# Patient Record
Sex: Female | Born: 1960 | Hispanic: No | Marital: Married | Smoking: Current every day smoker
Health system: Southern US, Community
[De-identification: ages and names within clinical notes are randomized; demographics above are authoritative.]

## PROBLEM LIST (undated history)

## (undated) DIAGNOSIS — R42 Dizziness and giddiness: Secondary | ICD-10-CM

## (undated) HISTORY — PX: APPENDECTOMY: SHX54

---

## 1998-02-03 ENCOUNTER — Other Ambulatory Visit: Admission: RE | Admit: 1998-02-03 | Discharge: 1998-02-03 | Payer: Self-pay | Admitting: Obstetrics & Gynecology

## 1999-02-07 ENCOUNTER — Other Ambulatory Visit: Admission: RE | Admit: 1999-02-07 | Discharge: 1999-02-07 | Payer: Self-pay | Admitting: Obstetrics and Gynecology

## 2000-02-06 ENCOUNTER — Other Ambulatory Visit: Admission: RE | Admit: 2000-02-06 | Discharge: 2000-02-06 | Payer: Self-pay | Admitting: Obstetrics and Gynecology

## 2001-02-28 ENCOUNTER — Other Ambulatory Visit: Admission: RE | Admit: 2001-02-28 | Discharge: 2001-02-28 | Payer: Self-pay | Admitting: Obstetrics and Gynecology

## 2001-06-08 ENCOUNTER — Observation Stay (HOSPITAL_COMMUNITY): Admission: EM | Admit: 2001-06-08 | Discharge: 2001-06-09 | Payer: Self-pay | Admitting: Emergency Medicine

## 2002-04-07 ENCOUNTER — Other Ambulatory Visit: Admission: RE | Admit: 2002-04-07 | Discharge: 2002-04-07 | Payer: Self-pay | Admitting: Obstetrics and Gynecology

## 2003-04-20 ENCOUNTER — Other Ambulatory Visit: Admission: RE | Admit: 2003-04-20 | Discharge: 2003-04-20 | Payer: Self-pay | Admitting: Obstetrics and Gynecology

## 2004-04-25 ENCOUNTER — Other Ambulatory Visit: Admission: RE | Admit: 2004-04-25 | Discharge: 2004-04-25 | Payer: Self-pay | Admitting: Obstetrics and Gynecology

## 2005-05-22 ENCOUNTER — Other Ambulatory Visit: Admission: RE | Admit: 2005-05-22 | Discharge: 2005-05-22 | Payer: Self-pay | Admitting: Obstetrics and Gynecology

## 2011-04-17 ENCOUNTER — Ambulatory Visit (INDEPENDENT_AMBULATORY_CARE_PROVIDER_SITE_OTHER): Payer: BC Managed Care – PPO | Admitting: Family Medicine

## 2011-04-17 VITALS — BP 121/68 | HR 81 | Temp 98.2°F | Resp 16 | Ht 64.75 in | Wt 147.2 lb

## 2011-04-17 DIAGNOSIS — N390 Urinary tract infection, site not specified: Secondary | ICD-10-CM

## 2011-04-17 DIAGNOSIS — R300 Dysuria: Secondary | ICD-10-CM

## 2011-04-17 LAB — POCT UA - MICROSCOPIC ONLY
Casts, Ur, LPF, POC: NEGATIVE
Crystals, Ur, HPF, POC: NEGATIVE
Mucus, UA: NEGATIVE
Yeast, UA: NEGATIVE

## 2011-04-17 LAB — POCT URINALYSIS DIPSTICK
Bilirubin, UA: NEGATIVE
Ketones, UA: NEGATIVE
Leukocytes, UA: NEGATIVE
Protein, UA: NEGATIVE
Spec Grav, UA: 1.01
pH, UA: 5.5

## 2011-04-17 MED ORDER — NITROFURANTOIN MONOHYD MACRO 100 MG PO CAPS: 100.0000 mg | ORAL_CAPSULE | Freq: Two times a day (BID) | ORAL | Status: AC

## 2011-04-17 NOTE — Progress Notes (Signed)
Patient Name: Joyce Pittman Date of Birth: November 01, 1960 Medical Record Number: [id number] Gender: female Date of Encounter: 04/17/2011  History of Present Illness:  Joyce Pittman is a 51 y.o. very pleasant female patient who presents with the following:  Here with urinary symptoms for a few days- started last week but she treated with cranberry juice and got better for a few days.  However, sympotms returned yesteday.  Notes frequency and dysuria.  Had a UTI about 2 years ago with similar symptoms.  No hematuria. No nausea, no vomiting, no fever- did notes a little back pain last night.  No vaginal symptoms.    LMP was about 3 weeks ago- on OCP.    There is no problem list on file for this patient.  History reviewed. No pertinent past medical history. History reviewed. No pertinent past surgical history. History  Substance Use Topics  . Smoking status: Current Everyday Smoker  . Smokeless tobacco: Not on file  . Alcohol Use: Yes     rare   Family History  Problem Relation Age of Onset  . Heart disease Mother   . Hyperlipidemia Mother    No Known Allergies  Medication list has been reviewed and updated.  Review of Systems: As per HPI- otherwise negative.  Physical Examination: Filed Vitals:   04/17/11 0835  BP: 121/68  Pulse: 81  Temp: 98.2 F (36.8 C)  TempSrc: Oral  Resp: 16  Height: 5' 4.75" (1.645 m)  Weight: 147 lb 3.2 oz (66.769 kg)    Body mass index is 24.68 kg/(m^2).  GEN: WDWN, NAD, Non-toxic, A & O x 3 HEENT: Atraumatic, Normocephalic. Neck supple. No masses, No LAD.  TM, oropharynx wnl Ears and Nose: No external deformity. CV: RRR, No M/G/R. No JVD. No thrill. No extra heart sounds. PULM: CTA B, no wheezes, crackles, rhonchi. No retractions. No resp. distress. No accessory muscle use. ABD: S, NT, ND, +BS. No rebound. No HSM.  No CVA tenderness EXTR: No c/c/e NEURO Normal gait.  PSYCH: Normally interactive. Conversant. Not depressed or anxious  appearing.  Calm demeanor.    Results for orders placed in visit on 04/17/11  POCT URINALYSIS DIPSTICK      Component Value Range   Color, UA yellow     Clarity, UA clear     Glucose, UA negative     Bilirubin, UA negative     Ketones, UA negative     Spec Grav, UA 1.010     Blood, UA trace     pH, UA 5.5     Protein, UA negative     Urobilinogen, UA 0.2     Nitrite, UA negative     Leukocytes, UA Negative    POCT UA - MICROSCOPIC ONLY      Component Value Range   WBC, Ur, HPF, POC 1-3     RBC, urine, microscopic 0-2     Bacteria, U Microscopic trace     Mucus, UA negative     Epithelial cells, urine per micros 1-2     Crystals, Ur, HPF, POC negative     Casts, Ur, LPF, POC negative     Yeast, UA negative      Assessment and Plan: 1. UTI (lower urinary tract infection)  Urine culture, nitrofurantoin, macrocrystal-monohydrate, (MACROBID) 100 MG capsule  2. Dysuria  POCT Urinalysis Dipstick, POCT UA - Microscopic Only   Urine is borderline, but she has been drinking a lot of water.  Await culture, but start tx with  macrobid.  Will f/u pending urine culture- Sooner if worse.

## 2011-04-20 ENCOUNTER — Other Ambulatory Visit: Payer: Self-pay | Admitting: Family Medicine

## 2011-04-20 DIAGNOSIS — N390 Urinary tract infection, site not specified: Secondary | ICD-10-CM

## 2011-04-20 MED ORDER — CIPROFLOXACIN HCL 500 MG PO TABS: 500.0000 mg | ORAL_TABLET | Freq: Two times a day (BID) | ORAL | Status: AC

## 2011-05-05 ENCOUNTER — Observation Stay (HOSPITAL_COMMUNITY)
Admission: EM | Admit: 2011-05-05 | Discharge: 2011-05-06 | Disposition: A | Discharge status: Home / Self Care | Payer: BC Managed Care – PPO | Source: Ambulatory Visit | Attending: Emergency Medicine | Admitting: Emergency Medicine

## 2011-05-05 ENCOUNTER — Encounter (HOSPITAL_COMMUNITY): Payer: Self-pay | Admitting: *Deleted

## 2011-05-05 DIAGNOSIS — M542 Cervicalgia: Secondary | ICD-10-CM

## 2011-05-05 DIAGNOSIS — R209 Unspecified disturbances of skin sensation: Principal | ICD-10-CM | POA: Insufficient documentation

## 2011-05-05 HISTORY — DX: Dizziness and giddiness: R42

## 2011-05-05 NOTE — ED Notes (Addendum)
Pt reports (L) sided neck pain that then made her numb down her (L) side that started at 9pm.  Reports that she was sitting when it happened.  Pt dizzy.  Pt ambulatory in triage.  Reports soreness, pain has decreased.  PERRLA.  CVA screen negative, no droop or drift noted, grips equal bilaterally, pt does have sensation on (L) side.  A/O x 4.  Pt has hx of dizziness and inner ear disease.

## 2011-05-06 ENCOUNTER — Emergency Department (HOSPITAL_COMMUNITY): Payer: BC Managed Care – PPO

## 2011-05-06 ENCOUNTER — Other Ambulatory Visit: Payer: Self-pay

## 2011-05-06 ENCOUNTER — Observation Stay (HOSPITAL_COMMUNITY): Payer: BC Managed Care – PPO

## 2011-05-06 DIAGNOSIS — G459 Transient cerebral ischemic attack, unspecified: Secondary | ICD-10-CM

## 2011-05-06 DIAGNOSIS — R209 Unspecified disturbances of skin sensation: Secondary | ICD-10-CM

## 2011-05-06 LAB — DIFFERENTIAL
Basophils Absolute: 0 K/uL (ref 0.0–0.1)
Basophils Relative: 0 % (ref 0–1)
Eosinophils Absolute: 0.2 K/uL (ref 0.0–0.7)
Eosinophils Relative: 2 % (ref 0–5)
Lymphocytes Relative: 26 % (ref 12–46)
Lymphs Abs: 3 K/uL (ref 0.7–4.0)
Monocytes Absolute: 0.8 K/uL (ref 0.1–1.0)
Monocytes Relative: 7 % (ref 3–12)
Neutro Abs: 7.5 K/uL (ref 1.7–7.7)
Neutrophils Relative %: 65 % (ref 43–77)

## 2011-05-06 LAB — URINALYSIS, ROUTINE W REFLEX MICROSCOPIC
Bilirubin Urine: NEGATIVE
Glucose, UA: NEGATIVE mg/dL
Hgb urine dipstick: NEGATIVE
Ketones, ur: NEGATIVE mg/dL
Leukocytes, UA: NEGATIVE
Nitrite: NEGATIVE
Protein, ur: NEGATIVE mg/dL
Specific Gravity, Urine: 1.015 (ref 1.005–1.030)
Urobilinogen, UA: 0.2 mg/dL (ref 0.0–1.0)
pH: 5.5 (ref 5.0–8.0)

## 2011-05-06 LAB — CBC
Hemoglobin: 13.2 g/dL (ref 12.0–15.0)
MCH: 32.3 pg (ref 26.0–34.0)
Platelets: 234 10*3/uL (ref 150–400)
RBC: 4.09 MIL/uL (ref 3.87–5.11)
WBC: 11.5 10*3/uL — ABNORMAL HIGH (ref 4.0–10.5)

## 2011-05-06 LAB — LIPID PANEL
Cholesterol: 155 mg/dL (ref 0–200)
HDL: 77 mg/dL
LDL Cholesterol: 68 mg/dL (ref 0–99)
Total CHOL/HDL Ratio: 2 ratio
Triglycerides: 50 mg/dL
VLDL: 10 mg/dL (ref 0–40)

## 2011-05-06 LAB — COMPREHENSIVE METABOLIC PANEL
Alkaline Phosphatase: 80 U/L (ref 39–117)
BUN: 10 mg/dL (ref 6–23)
Calcium: 8.4 mg/dL (ref 8.4–10.5)
Creatinine, Ser: 0.52 mg/dL (ref 0.50–1.10)
GFR calc Af Amer: >90 mL/min (ref >90)
Glucose, Bld: 90 mg/dL (ref 70–99)
Total Protein: 6.7 g/dL (ref 6.0–8.3)

## 2011-05-06 LAB — APTT: aPTT: 36 s (ref 24–37)

## 2011-05-06 LAB — PROTIME-INR
INR: 1.04 (ref 0.00–1.49)
Prothrombin Time: 13.8 s (ref 11.6–15.2)

## 2011-05-06 LAB — POCT I-STAT, CHEM 8
Calcium, Ion: 1.14 mmol/L (ref 1.12–1.32)
Chloride: 108 mEq/L (ref 96–112)
HCT: 39 % (ref 36.0–46.0)
Hemoglobin: 13.3 g/dL (ref 12.0–15.0)
TCO2: 22 mmol/L (ref 0–100)

## 2011-05-06 LAB — HEMOGLOBIN A1C
Hgb A1c MFr Bld: 5.3 %
Mean Plasma Glucose: 105 mg/dL

## 2011-05-06 MED ORDER — IOHEXOL 350 MG/ML SOLN
60.0000 mL | Freq: Once | INTRAVENOUS | Status: AC | PRN
  Administered 2011-05-06: 60 mL via INTRAVENOUS

## 2011-05-06 MED ORDER — ACETAMINOPHEN 325 MG PO TABS: 650.0000 mg | ORAL_TABLET | ORAL | Status: DC | PRN

## 2011-05-06 MED ORDER — LORAZEPAM 2 MG/ML IJ SOLN: 1.0000 mg | Freq: Once | INTRAMUSCULAR | Status: DC

## 2011-05-06 NOTE — ED Notes (Signed)
Patient is resting comfortably. 

## 2011-05-06 NOTE — ED Provider Notes (Signed)
Patient refused MRI on multiple occasions despite receiving Ativan. I explained the implications of refusing the test including missing a stroke. The patient accepts this risk and understands the consequences. A CTA will be performed in place. She also is awaiting carotid Dopplers.  Dayton Bailiff, MD 05/06/11 1001

## 2011-05-06 NOTE — Progress Notes (Signed)
VASCULAR LAB PRELIMINARY  PRELIMINARY  PRELIMINARY  PRELIMINARY  Carotid Dopplers completed.    Preliminary report:  No significant ICA stenosis.  Vertebral artery flow is antegrade.  Both diastolic and systolic velocities are elevated throughout, though plaque morphology does not support stenosis.  Joyce Pittman, 05/06/2011, 11:52 AM

## 2011-05-06 NOTE — ED Notes (Signed)
Pt denies pain, numbness, weakness at this time.

## 2011-05-06 NOTE — ED Provider Notes (Signed)
History     CSN: [id number]  Arrival date & time 05/05/11  2301   First MD Initiated Contact with Patient 05/06/11 0403      Chief Complaint  Patient presents with  . Numbness    (Consider location/radiation/quality/duration/timing/severity/associated sxs/prior treatment) HPI Is a 51 year old white female who felt a sudden pain in the left side of her neck about 11 PM yesterday. The pain was located in the muscles of the neck. This was followed soon by numbness over the left side of her face which then progressed to include the entire left side of her body. There was no associated weakness although she did have the sensation that she was moving to the left when she walked. There was no associated visual changes or difficulty with speaking. The numbness has subsequently resolved after several hours. The pain in the left side of her neck is still present. She also experienced sharp pain in the left occipital region soon after the pain in her neck; that pain has resolved. There are no known mitigating or exacerbating factors other than tenderness on palpation of the neck muscles.  Past Medical History  Diagnosis Date  . Dizziness     History reviewed. No pertinent past surgical history.  Family History  Problem Relation Age of Onset  . Heart disease Mother   . Hyperlipidemia Mother     History  Substance Use Topics  . Smoking status: Current Everyday Smoker -- 1.0 packs/day  . Smokeless tobacco: Not on file  . Alcohol Use: Yes     rare    OB History    Grav Para Term Preterm Abortions TAB SAB Ect Mult Living                  Review of Systems  All other systems reviewed and are negative.    Allergies  Review of patient's allergies indicates no known allergies.  Home Medications   Current Outpatient Rx  Name Route Sig Dispense Refill  . DROSPIRENONE-ETHINYL ESTRADIOL 3-0.03 MG PO TABS Oral Take 1 tablet by mouth daily.    . ADULT MULTIVITAMIN W/MINERALS CH Oral  Take 1 tablet by mouth daily.      BP 115/49  Pulse 72  Temp(Src) 98.2 F (36.8 C) (Oral)  Resp 18  SpO2 97%  LMP 04/22/2011  Physical Exam General: Well-developed, well-nourished female in no acute distress; appearance consistent with age of record HENT: normocephalic, atraumatic Eyes: pupils equal round and reactive to light; extraocular muscles intact Neck: supple; no carotid bruit Heart: regular rate and rhythm Lungs: clear to auscultation bilaterally Abdomen: soft; nondistended; nontender Extremities: No deformity; full range of motion; Neurologic: Awake, alert and oriented; motor function intact in all extremities and symmetric; no facial droop Skin: Warm and dry Psychiatric: Normal mood and affect    ED Course  Procedures (including critical care time)     MDM   Nursing notes and vitals signs, including pulse oximetry, reviewed.  Summary of this visit's results, reviewed by myself:   Imaging Studies: Ct Head Wo Contrast  05/06/2011  *RADIOLOGY REPORT*  Clinical Data: Left neck pain, facial numbness  CT HEAD WITHOUT CONTRAST  Technique:  Contiguous axial images were obtained from the base of the skull through the vertex without contrast.  Comparison: None.  Findings: No evidence of parenchymal hemorrhage or extra-axial fluid collection. No mass lesion, mass effect, or midline shift.  No CT evidence of acute infarction.  Cerebral volume is age appropriate.  No ventriculomegaly.  The visualized paranasal sinuses are essentially clear. The mastoid air cells are unopacified.  No evidence of calvarial fracture.  IMPRESSION: Normal head CT.  Original Report Authenticated By: Charline Bills, M.D.   4:18 AM We'll place on TIA protocol in the CDU for an ABCD2 score of 2.  7:40 AM Patient discussed with with Dr. Brooke Dare who will provide continuity of care.         Hanley Seamen, MD 05/06/11 [phone]

## 2011-05-06 NOTE — ED Notes (Signed)
Jamal called from mri and reports that pt has refused to have study completed even if she gets ativan for sedation. Dr Brooke Dare made aware.

## 2011-05-06 NOTE — ED Provider Notes (Signed)
She is resting comfortably. CT Angio completed, waiting on read. No needs at this time.  14:45 - CT Angio normal, patient remains asymptomatic. Feel she can be discharged home.  Rodena Medin, PA-C 05/06/11 1452

## 2011-05-06 NOTE — Discharge Instructions (Signed)
YOUR CT STUDY IS COMPLETELY NEGATIVE AND YOU CAN BE DISCHARGED HOME TO FOLLOW UP WITH YOUR DOCTOR FOR RECHECK THIS WEEK. RETURN HERE WITH ANY WORSENING SYMPTOMS OR NEW CONCERNS.

## 2011-05-06 NOTE — Progress Notes (Signed)
  Echocardiogram 2D Echocardiogram has been performed.  Tykesha Konicki, Real Cons 05/06/2011, 10:39 AM

## 2011-05-06 NOTE — ED Notes (Signed)
The pt and family getting impatient that the pt has not been seen by a doctor yet.

## 2011-05-06 NOTE — ED Notes (Signed)
Pt ambulatory to bathroom without assistance.

## 2011-05-06 NOTE — ED Notes (Signed)
approx 3 hours ago the pt developed lt facial numbness with some pain in the back of her head then the numbness  Spread to the entire lt side of her body. These symptoms lasted for approx 3 hours.  The numbness is totally gone now..   She still has a sl headache whenever she walks otherwise she has no pain just lying.  Alert skin warm and dry no distress.

## 2011-05-06 NOTE — ED Notes (Signed)
MD at bedside. 

## 2011-05-06 NOTE — ED Notes (Signed)
The pt is not having any pain or other symptoms.  Family remains at the bedside

## 2011-05-06 NOTE — ED Notes (Signed)
Vascular Tech is here to do carotid doppler study. New iv placed in left ac for ct angiol

## 2011-05-11 ENCOUNTER — Ambulatory Visit (INDEPENDENT_AMBULATORY_CARE_PROVIDER_SITE_OTHER): Payer: BC Managed Care – PPO | Admitting: Family Medicine

## 2011-05-11 ENCOUNTER — Telehealth: Payer: Self-pay

## 2011-05-11 VITALS — BP 124/65 | HR 86 | Temp 98.0°F | Resp 16 | Ht 64.0 in | Wt 145.0 lb

## 2011-05-11 DIAGNOSIS — R200 Anesthesia of skin: Secondary | ICD-10-CM

## 2011-05-11 DIAGNOSIS — R209 Unspecified disturbances of skin sensation: Secondary | ICD-10-CM

## 2011-05-11 LAB — D-DIMER, QUANTITATIVE: D-Dimer, Quant: 0.51 ug/mL-FEU — ABNORMAL HIGH (ref 0.00–0.48)

## 2011-05-11 LAB — POCT SEDIMENTATION RATE: POCT SED RATE: 31 mm/hr — AB (ref 0–22)

## 2011-05-11 NOTE — Progress Notes (Signed)
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  She had associated numbness on entire left side of body with cold skin associated.    Had Cipro for UTI several weeks ago.  O:  Both vertebral arteries are patent to the basilar. PICA, AICA,  superior cerebellar, and posterior cerebral arteries are all widely  patent without significant stenosis. No vascular malformation in  the posterior circulation.  Internal carotid artery is widely patent bilaterally without  atherosclerotic disease or stenosis. Anterior and middle cerebral  arteries are patent bilaterally. No significant intracranial  stenosis.  Negative for vascular malformation.  Major dural sinuses are patent.  Review of the MIP images confirms the above findings.  IMPRESSION:  Normal  MRI HEAD WITHOUT CONTRAST  Technique: Multiplanar, multiecho pulse sequences of the brain and  surrounding structures were obtained according to standard protocol  without intravenous contrast.  Comparison: CT head 05/06/2011  Findings: Limited study was performed with axial diffusion and T2  and sagittal T1 images.  Negative for acute infarct. No significant chronic ischemia. No  mass lesion present. Ventricle size is normal.  IMPRESSION:  Normal   Results for orders placed during the hospital encounter of 05/05/11  CBC      Component Value Range   WBC 11.5 (*) 4.0 - 10.5 (K/uL)   RBC 4.09  3.87 - 5.11 (MIL/uL)   Hemoglobin 13.2  12.0 - 15.0 (g/dL)   HCT 16.1  09.6 - 04.5 (%)   MCV 95.1  78.0 - 100.0 (fL)   MCH 32.3  26.0 - 34.0 (pg)   MCHC 33.9  30.0 - 36.0 (g/dL)   RDW 40.9  81.1 - 91.4 (%)   Platelets 234  150 - 400 (K/uL)  DIFFERENTIAL      Component Value Range   Neutrophils Relative 65  43 - 77 (%)   Neutro Abs 7.5  1.7 - 7.7 (K/uL)   Lymphocytes Relative 26  12 - 46 (%)   Lymphs Abs 3.0  0.7 - 4.0 (K/uL)   Monocytes Relative 7  3 -  12 (%)   Monocytes Absolute 0.8  0.1 - 1.0 (K/uL)   Eosinophils Relative 2  0 - 5 (%)   Eosinophils Absolute 0.2  0.0 - 0.7 (K/uL)   Basophils Relative 0  0 - 1 (%)   Basophils Absolute 0.0  0.0 - 0.1 (K/uL)  COMPREHENSIVE METABOLIC PANEL      Component Value Range   Sodium 138  135 - 145 (mEq/L)   Potassium 4.2  3.5 - 5.1 (mEq/L)   Chloride 105  96 - 112 (mEq/L)   CO2 22  19 - 32 (mEq/L)   Glucose, Bld 90  70 - 99 (mg/dL)   BUN 10  6 - 23 (mg/dL)   Creatinine, Ser 7.82  0.50 - 1.10 (mg/dL)   Calcium 8.4  8.4 - 95.6 (mg/dL)   Total Protein 6.7  6.0 - 8.3 (g/dL)   Albumin 3.2 (*) 3.5 - 5.2 (g/dL)   AST 31  0 - 37 (U/L)   ALT 43 (*) 0 - 35 (U/L)   Alkaline Phosphatase 80  39 - 117 (U/L)   Total Bilirubin 0.2 (*) 0.3 - 1.2 (mg/dL)   GFR calc non Af Amer >90  >90 (mL/min)   GFR calc Af Amer >90  >90 (mL/min)  PROTIME-INR      Component Value Range   Prothrombin  Time 13.8  11.6 - 15.2 (seconds)   INR 1.04  0.00 - 1.49   APTT      Component Value Range   aPTT 36  24 - 37 (seconds)  URINALYSIS, ROUTINE W REFLEX MICROSCOPIC      Component Value Range   Color, Urine YELLOW  YELLOW    APPearance CLEAR  CLEAR    Specific Gravity, Urine 1.015  1.005 - 1.030    pH 5.5  5.0 - 8.0    Glucose, UA NEGATIVE  NEGATIVE (mg/dL)   Hgb urine dipstick NEGATIVE  NEGATIVE    Bilirubin Urine NEGATIVE  NEGATIVE    Ketones, ur NEGATIVE  NEGATIVE (mg/dL)   Protein, ur NEGATIVE  NEGATIVE (mg/dL)   Urobilinogen, UA 0.2  0.0 - 1.0 (mg/dL)   Nitrite NEGATIVE  NEGATIVE    Leukocytes, UA NEGATIVE  NEGATIVE   LIPID PANEL      Component Value Range   Cholesterol 155  0 - 200 (mg/dL)   Triglycerides 50  <161 (mg/dL)   HDL 77  >09 (mg/dL)   Total CHOL/HDL Ratio 2.0     VLDL 10  0 - 40 (mg/dL)   LDL Cholesterol 68  0 - 99 (mg/dL)  HEMOGLOBIN U0A      Component Value Range   Hemoglobin A1C 5.3  <5.7 (%)   Mean Plasma Glucose 105  <117 (mg/dL)  POCT I-STAT, CHEM 8      Component Value Range   Sodium  141  135 - 145 (mEq/L)   Potassium 4.1  3.5 - 5.1 (mEq/L)   Chloride 108  96 - 112 (mEq/L)   BUN 9  6 - 23 (mg/dL)   Creatinine, Ser 5.40  0.50 - 1.10 (mg/dL)   Glucose, Bld 91  70 - 99 (mg/dL)   Calcium, Ion 9.81  1.91 - 1.32 (mmol/L)   TCO2 22  0 - 100 (mmol/L)   Hemoglobin 13.3  12.0 - 15.0 (g/dL)   HCT 47.8  29.5 - 62.1 (%)    PE today: alert, NAD HEENT:  Normal with normal fundi Neck:  Supple, no adenopathy.  Prominent left trapezius; no bruit Chest:  Clear Heart:  Reg, no murmur Neuro:  Cn III-XII intact  Motor: symmetric  Sensory:  Normal  Gait: normal  A:  Unusual syndrome that may be cervical spine disease;  No significant abnormalities found to explain this  P:  Neuro consult Check d'dimer and sed rate Results for orders placed in visit on 05/11/11  POCT SEDIMENTATION RATE      Component Value Range   POCT SED RATE 31 (*) 0 - 22 (mm/hr)

## 2011-05-11 NOTE — Progress Notes (Signed)
Observation review is complete for the 4/5 visit.

## 2011-05-16 ENCOUNTER — Other Ambulatory Visit: Payer: Self-pay | Admitting: Family Medicine

## 2011-05-16 DIAGNOSIS — M542 Cervicalgia: Secondary | ICD-10-CM

## 2011-05-16 MED ORDER — TRAMADOL HCL 50 MG PO TABS: 50.0000 mg | ORAL_TABLET | Freq: Three times a day (TID) | ORAL | Status: AC | PRN

## 2011-05-16 NOTE — Telephone Encounter (Signed)
Pt is calling stating that she can not get into [facility] til 05/22/11 and is needing a pain medication for muscle spasms .  And would like to talk with dr Milus Glazier about the referral to [facility] because the numbness has stopped

## 2011-05-17 NOTE — Telephone Encounter (Signed)
I spoke with patient yesterday and I requested an ortho referral for her neck.  Did this go through?

## 2013-04-28 ENCOUNTER — Encounter: Payer: Self-pay | Admitting: Family Medicine

## 2013-04-28 ENCOUNTER — Ambulatory Visit (INDEPENDENT_AMBULATORY_CARE_PROVIDER_SITE_OTHER): Payer: BC Managed Care – PPO | Admitting: Family Medicine

## 2013-04-28 VITALS — BP 118/67 | HR 74 | Temp 97.7°F | Resp 16 | Ht 65.0 in | Wt 167.0 lb

## 2013-04-28 DIAGNOSIS — Z8739 Personal history of other diseases of the musculoskeletal system and connective tissue: Secondary | ICD-10-CM | POA: Insufficient documentation

## 2013-04-28 DIAGNOSIS — R635 Abnormal weight gain: Secondary | ICD-10-CM

## 2013-04-28 DIAGNOSIS — Z23 Encounter for immunization: Secondary | ICD-10-CM

## 2013-04-28 DIAGNOSIS — Z13220 Encounter for screening for lipoid disorders: Secondary | ICD-10-CM

## 2013-04-28 LAB — CBC WITH DIFFERENTIAL/PLATELET
BASOS ABS: 0 10*3/uL (ref 0.0–0.1)
Basophils Relative: 0 % (ref 0–1)
EOS PCT: 1 % (ref 0–5)
Eosinophils Absolute: 0.1 10*3/uL (ref 0.0–0.7)
HEMATOCRIT: 40.3 % (ref 36.0–46.0)
HEMOGLOBIN: 13.9 g/dL (ref 12.0–15.0)
LYMPHS ABS: 1.5 10*3/uL (ref 0.7–4.0)
LYMPHS PCT: 24 % (ref 12–46)
MCH: 32.3 pg (ref 26.0–34.0)
MCHC: 34.5 g/dL (ref 30.0–36.0)
MCV: 93.7 fL (ref 78.0–100.0)
MONO ABS: 0.4 10*3/uL (ref 0.1–1.0)
MONOS PCT: 6 % (ref 3–12)
NEUTROS ABS: 4.4 10*3/uL (ref 1.7–7.7)
Neutrophils Relative %: 69 % (ref 43–77)
Platelets: 225 10*3/uL (ref 150–400)
RBC: 4.3 MIL/uL (ref 3.87–5.11)
RDW: 13 % (ref 11.5–15.5)
WBC: 6.4 10*3/uL (ref 4.0–10.5)

## 2013-04-28 LAB — COMPREHENSIVE METABOLIC PANEL
ALT: 12 U/L (ref 0–35)
AST: 13 U/L (ref 0–37)
Albumin: 4.3 g/dL (ref 3.5–5.2)
Alkaline Phosphatase: 53 U/L (ref 39–117)
BILIRUBIN TOTAL: 0.5 mg/dL (ref 0.2–1.2)
BUN: 12 mg/dL (ref 6–23)
CALCIUM: 9.4 mg/dL (ref 8.4–10.5)
CHLORIDE: 107 meq/L (ref 96–112)
CO2: 24 mEq/L (ref 19–32)
CREATININE: 0.72 mg/dL (ref 0.50–1.10)
Glucose, Bld: 97 mg/dL (ref 70–99)
Potassium: 5 mEq/L (ref 3.5–5.3)
Sodium: 138 mEq/L (ref 135–145)
Total Protein: 7.1 g/dL (ref 6.0–8.3)

## 2013-04-28 LAB — LIPID PANEL
CHOL/HDL RATIO: 2.6 ratio
CHOLESTEROL: 174 mg/dL (ref 0–200)
HDL: 66 mg/dL (ref >39)
LDL Cholesterol: 86 mg/dL (ref 0–99)
TRIGLYCERIDES: 112 mg/dL (ref <150)
VLDL: 22 mg/dL (ref 0–40)

## 2013-04-28 LAB — TSH: TSH: 1.843 u[IU]/mL (ref 0.350–4.500)

## 2013-04-28 NOTE — Patient Instructions (Addendum)
Good to see you today!  I will be in touch with your labs.  You received your tetanus shot today- the Tdap which also protects you against pertussis or whooping cough.  Congrats on quitting smoking.    It is time for your colonoscopy. Please call the GI office of your choice-  [facility], [facility] or [facility] are some good options in town.  You can schedule this test at your convenience.

## 2013-04-28 NOTE — Progress Notes (Signed)
[facility] [address], [place] [id number] [phone]  Date:  04/28/2013   Name:  Joyce PelCynthia Imburgia   DOB:  05/27/1960   MRN:  [id number]  PCP:  No primary provider on file.    Chief Complaint: Establish Care   History of Present Illness:  Joyce PelCynthia Schlitt is a 53 y.o. very pleasant female patient who presents with the following:  She is here today to establish care.   She has had some history of muscle spasms, but otherwise she is generally healthy.   She sees Huel CoteKathy Richardson for her GYN care.   Her pap and mammogram are UTD- she will be seen in July for her next mammogram, and her pap in September She does notice that she gained some weight recently; she had been stable for the last 10- 15 years but then started to gain.  She has put on about 20 lbs in the last couple of years.  She has managed to lose about 5 lbs through exercise.  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  She enjoys gardening, hunting and fishing  She is still on OCP.   She did quit smoking- 03/29/2012.    She has not eaten yet today.   Unsure date of last tetanus but it has been a while No colonoscopy yet.  Wt Readings from Last 3 Encounters:  04/28/13 167 lb (75.751 kg)  05/11/11 145 lb (65.772 kg)  04/17/11 147 lb 3.2 oz (66.769 kg)    There are no active problems to display for this patient.   Past Medical History  Diagnosis Date  . Dizziness     Past Surgical History  Procedure Laterality Date  . Appendectomy      History  Substance Use Topics  . Smoking status: Former Smoker -- 1.00 packs/day    Types: Cigarettes  . Smokeless tobacco: Not on file  . Alcohol Use: Yes     Comment: social    Family History  Problem Relation Age of Onset  . Heart disease Mother   . Hyperlipidemia Mother     No Known Allergies  Medication list has been reviewed and updated.  Current Outpatient Prescriptions on File Prior to  Visit  Medication Sig Dispense Refill  . drospirenone-ethinyl estradiol (OCELLA) 3-0.03 MG tablet Take 1 tablet by mouth daily.      . Multiple Vitamin (MULITIVITAMIN WITH MINERALS) TABS Take 1 tablet by mouth daily.       No current facility-administered medications on file prior to visit.    Review of Systems:  As per HPI- otherwise negative.   Physical Examination: Filed Vitals:   04/28/13 0905  BP: 118/67  Pulse: 74  Temp: 97.7 F (36.5 C)  Resp: 16   Filed Vitals:   04/28/13 0905  Height: 5\' 5"  (1.651 m)  Weight: 167 lb (75.751 kg)   Body mass index is 27.79 kg/(m^2). Ideal Body Weight: Weight in (lb) to have BMI = 25: 149.9  GEN: WDWN, NAD, Non-toxic, A & O x 3, looks well HEENT: Atraumatic, Normocephalic. Neck supple. No masses, No LAD. Ears and Nose: No external deformity. CV: RRR, No M/G/R. No JVD. No thrill. No extra heart sounds. PULM: CTA B, no wheezes, crackles, rhonchi. No retractions. No resp. distress. No accessory muscle use. ABD: S, NT, ND. No rebound. No HSM. EXTR: No c/c/e NEURO Normal gait.  PSYCH: Normally interactive. Conversant. Not depressed or  anxious appearing.  Calm demeanor.    Assessment and Plan: Screening for hyperlipidemia - Plan: Lipid panel  Weight gain - Plan: CBC with Differential, TSH, Comprehensive metabolic panel  Immunization due - Plan: Tdap vaccine greater than or equal to 7yo IM  History of muscle spasm  Recent weight gain. Discussed with pt in detail.  May be just menopause but we will check her thyroid.  She is willing to work on her weight with increased exercise and fewer calories.  Update tdap Labs today as above.   Her GYN care is UTD Encouraged her to schedule a colonoscopy   Signed Abbe Amsterdam, MD

## 2014-12-10 ENCOUNTER — Ambulatory Visit (INDEPENDENT_AMBULATORY_CARE_PROVIDER_SITE_OTHER): Payer: BLUE CROSS/BLUE SHIELD

## 2014-12-10 ENCOUNTER — Ambulatory Visit (INDEPENDENT_AMBULATORY_CARE_PROVIDER_SITE_OTHER): Payer: BLUE CROSS/BLUE SHIELD | Admitting: Family Medicine

## 2014-12-10 VITALS — BP 110/70 | HR 87 | Temp 98.3°F | Resp 18 | Ht 64.0 in | Wt 161.8 lb

## 2014-12-10 DIAGNOSIS — M25461 Effusion, right knee: Secondary | ICD-10-CM | POA: Diagnosis not present

## 2014-12-10 NOTE — Progress Notes (Signed)
This chart was scribed for Elvina Sidle, MD by Stann Ore, medical scribe at [facility].The patient was seen in exam room 13 and the patient's care was started at 1:36 PM.  Patient ID: Joyce Pittman MRN: [id number], DOB: 05-04-1960, 54 y.o. Date of Encounter: 12/10/2014  Primary Physician: No primary care provider on file.  Chief Complaint:  Chief Complaint  Patient presents with  . OTHER    knee swollen, couple weeks    HPI:  Bernadette Gores is a 54 y.o. female who presents to [facility] complaining of right knee swollen with aches. Last week, she turned it and felt aches in the area. Yesterday afternoon, she felt a popping sensation and now feels pain in the area.   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   Past Medical History  Diagnosis Date  . Dizziness      Home Meds: Prior to Admission medications   Medication Sig Start Date End Date Taking? Authorizing Provider  Multiple Vitamin (MULITIVITAMIN WITH MINERALS) TABS Take 1 tablet by mouth daily.   Yes Historical Provider, MD  drospirenone-ethinyl estradiol (OCELLA) 3-0.03 MG tablet Take 1 tablet by mouth daily.    Historical Provider, MD    Allergies: No Known Allergies  Social History   Social History  . Marital Status: Married    Spouse Name: N/A  . Number of Children: N/A  . Years of Education: N/A   Occupational History  . Not on file.   Social History Main Topics  . Smoking status: Former Smoker -- 1.00 packs/day    Types: Cigarettes  . Smokeless tobacco: Not on file  . Alcohol Use: Yes     Comment: social  . Drug Use: No  . Sexual Activity: Yes    Birth Control/ Protection: Pill   Other Topics Concern  . Not on file   Social History Narrative     Review of Systems: Constitutional: negative for fever, chills, night sweats, weight changes, or fatigue  HEENT: negative for vision changes, hearing loss, congestion, rhinorrhea, ST, epistaxis, or  sinus pressure Cardiovascular: negative for chest pain or palpitations Respiratory: negative for hemoptysis, wheezing, shortness of breath, or cough Abdominal: negative for abdominal pain, nausea, vomiting, diarrhea, or constipation Dermatological: negative for rash Neurologic: negative for headache, dizziness, or syncope Musc: positive for arthralgia (right knee)  All other systems reviewed and are otherwise negative with the exception to those above and in the HPI.  Physical Exam: Blood pressure 110/70, pulse 87, temperature 98.3 F (36.8 C), temperature source Oral, resp. rate 18, height  (1.626 m), weight 161 lb 12.8 oz (73.392 kg), last menstrual period 03/24/2014, SpO2 98 %., Body mass index is 27.76 kg/(m^2). General: Well developed, well nourished, in no acute distress. Head: Normocephalic, atraumatic, eyes without discharge, sclera non-icteric, nares are without discharge. Bilateral auditory canals clear, TM's are without perforation, pearly grey and translucent with reflective cone of light bilaterally. Oral cavity moist, posterior pharynx without exudate, erythema, peritonsillar abscess, or post nasal drip.  Neck: Supple. No thyromegaly. Full ROM. No lymphadenopathy. Lungs: Clear bilaterally to auscultation without wheezes, rales, or rhonchi. Breathing is unlabored. Heart: RRR with S1 S2. No murmurs, rubs, or gallops appreciated. Msk:  Strength and tone normal for age. Extremities/Skin: Warm and dry. No clubbing or cyanosis. Right knee swollen and mildly tender with lateral stress, negative lachman's test,  Neuro: Alert and oriented X 3. Moves all extremities spontaneously. Gait is normal. CNII-XII grossly  in tact. Psych:  Responds to questions appropriately with a normal affect.   [facility] reading (PRIMARY) by Dr. Milus GlazierLauenstein : right knee xray: soft tissue swelling, small calcification just anterior tubercle    ASSESSMENT AND PLAN:  54 y.o. year old female with  This chart was  scribed in my presence and reviewed by me personally.    ICD-9-CM ICD-10-CM   1. Knee effusion, right 719.06 M25.461 DG Knee Complete 4 Views Right     AMB referral to orthopedics    Urgent ortho referral  By signing my name below, I, Stann Oresung-Kai Tsai, attest that this documentation has been prepared under the direction and in the presence of Elvina SidleKurt Shakeem Stern, MD. Electronically Signed: Stann Oresung-Kai Tsai, Scribe. 12/10/2014 , 1:36 PM .  Signed, Elvina SidleKurt Brailon Don, MD 12/10/2014 1:36 PM

## 2014-12-10 NOTE — Patient Instructions (Signed)
You have an appt scheduled at [facility] today at 2:45 pm with Dr. Madelon Lipsaffrey. Office address is [employer] ph# [phone]  Knee Effusion Knee effusion means that you have excess fluid in your knee joint. This can cause pain and swelling in your knee. This may make your knee more difficult to bend and move. That is because there is increased pain and pressure in the joint. If there is fluid in your knee, it often means that something is wrong inside your knee, such as severe arthritis, abnormal inflammation, or an infection. Another common cause of knee effusion is an injury to the knee muscles, ligaments, or cartilage. HOME CARE INSTRUCTIONS  Use crutches as directed by your health care provider.  Wear a knee brace as directed by your health care provider.  Apply ice to the swollen area:  Put ice in a plastic bag.  Place a towel between your skin and the bag.  Leave the ice on for 20 minutes, 2-3 times per day.  Keep your knee raised (elevated) when you are sitting or lying down.  Take medicines only as directed by your health care provider.  Do any rehabilitation or strengthening exercises as directed by your health care provider.  Rest your knee as directed by your health care provider. You may start doing your normal activities again when your health care provider approves.   Keep all follow-up visits as directed by your health care provider. This is important. SEEK MEDICAL CARE IF:  You have ongoing (persistent) pain in your knee. SEEK IMMEDIATE MEDICAL CARE IF:  You have increased swelling or redness of your knee.  You have severe pain in your knee.  You have a fever.   This information is not intended to replace advice given to you by your health care provider. Make sure you discuss any questions you have with your health care provider.   Document Released: 04/08/2003 Document Revised: 02/06/2014 Document Reviewed: 09/01/2013 Elsevier Interactive  Patient Education 2016 Elsevier Inc. Meniscus Tear A meniscus tear is a knee injury in which a piece of the meniscus is torn. The meniscus is a thick, rubbery, wedge-shaped cartilage in the knee. Two menisci are located in each knee. They sit between the upper bone (femur) and lower bone (tibia) that make up the knee joint. Each meniscus acts as a shock absorber for the knee. A torn meniscus is one of the most common types of knee injuries. This injury can range from mild to severe. Surgery may be needed for a severe tear. CAUSES This injury may be caused by any squatting, twisting, or pivoting movement. Sports-related injuries are the most common cause. These often occur from:  Running and stopping suddenly.  Changing direction.  Being tackled or knocked off your feet. As people get older, their meniscus gets thinner and weaker. In these people, tears can happen more easily, such as from climbing stairs.  RISK FACTORS This injury is more likely to happen to:  People who play contact sports.  Males.  People who are 4430-52 years of age. SYMPTOMS  Symptoms of this injury include:  Knee pain, especially at the side of the knee joint. You may feel pain when the injury occurs, or you may only hear a pop and feel pain later.  A feeling that your knee is clicking, catching, locking, or giving way.  Not being able to fully bend or extend your knee.  Bruising or swelling in your knee. DIAGNOSIS  This injury  may be diagnosed based on your symptoms and a physical exam. The physical exam may include:  Moving your knee in different ways.  Feeling for tenderness.  Listening for a clicking sound.  Checking if your knee locks or catches. You may also have tests, such as:  X-rays.  MRI.  A procedure to look inside your knee with a narrow surgical telescope (arthroscopy). You may be referred to a knee specialist (orthopedic surgeon). TREATMENT  Treatment for this injury depends on the  severity of the tear. Treatment for a mild tear may include:  Rest.  Medicine to reduce pain and swelling. This is usually a nonsteroidal anti-inflammatory drug (NSAID).  A knee brace or an elastic sleeve or wrap.  Using crutches or a walker to keep weight off your knee and to help you walk.  Exercises to strengthen your knee (physical therapy). You may need surgery if you have a severe tear or if other treatments are not working.  HOME CARE INSTRUCTIONS Managing Pain and Swelling  Take over-the-counter and prescription medicines only as told by your health care provider.  If directed, apply ice to the injured area:  Put ice in a plastic bag.  Place a towel between your skin and the bag.  Leave the ice on for 20 minutes, 2-3 times per day.  Raise (elevate) the injured area above the level of your heart while you are sitting or lying down. Activity  Do not use the injured limb to support your body weight until your health care provider says that you can. Use crutches or a walker as told by your health care provider.  Return to your normal activities as told by your health care provider. Ask your health care provider what activities are safe for you.  Perform range-of-motion exercises only as told by your health care provider.  Begin doing exercises to strengthen your knee and leg muscles only as told by your health care provider. After you recover, your health care provider may recommend these exercises to help prevent another injury. General Instructions  Use a knee brace or elastic wrap as told by your health care provider.  Keep all follow-up visits as told by your health care provider. This is important. SEEK MEDICAL CARE IF:  You have a fever.  Your knee becomes red, tender, or swollen.  Your pain medicine is not helping.  Your symptoms get worse or do not improve after 2 weeks of home care.   This information is not intended to replace advice given to you by  your health care provider. Make sure you discuss any questions you have with your health care provider.   Document Released: 04/08/2002 Document Revised: 10/07/2014 Document Reviewed: 05/11/2014 Elsevier Interactive Patient Education [employer]

## 2015-05-06 ENCOUNTER — Ambulatory Visit (INDEPENDENT_AMBULATORY_CARE_PROVIDER_SITE_OTHER): Payer: BLUE CROSS/BLUE SHIELD | Admitting: Family Medicine

## 2015-05-06 ENCOUNTER — Encounter: Payer: Self-pay | Admitting: Family Medicine

## 2015-05-06 VITALS — BP 122/74 | HR 86 | Temp 98.6°F | Ht 64.0 in | Wt 163.4 lb

## 2015-05-06 DIAGNOSIS — M7989 Other specified soft tissue disorders: Secondary | ICD-10-CM

## 2015-05-06 DIAGNOSIS — M2540 Effusion, unspecified joint: Secondary | ICD-10-CM

## 2015-05-06 DIAGNOSIS — M2550 Pain in unspecified joint: Secondary | ICD-10-CM | POA: Diagnosis not present

## 2015-05-06 LAB — RHEUMATOID FACTOR: Rhuematoid fact SerPl-aCnc: <10 IU/mL (ref <=14)

## 2015-05-06 MED ORDER — CYCLOBENZAPRINE HCL 10 MG PO TABS: 10.0000 mg | ORAL_TABLET | Freq: Two times a day (BID) | ORAL | Status: DC | PRN

## 2015-05-06 NOTE — Patient Instructions (Addendum)
We will do some labwork today to make sure there is no sign of rheumatoid arthritis I will be in touch with your labs asap Try the flexeril as needed but remember this can make you sleepy!  Do not use if you need to drive and do not combine with hydrocodone  Let me know if you have any change or worsening of your symptoms and I will be in touch with results soon

## 2015-05-06 NOTE — Progress Notes (Signed)
Pre visit review using our clinic review tool, if applicable. No additional management support is needed unless otherwise documented below in the visit note. 

## 2015-05-06 NOTE — Progress Notes (Signed)
[place] at [facility] [address] [place], [place] [address] [phone]  Date:  05/06/2015   Name:  Joyce Pittman   DOB:  1960/04/04   MRN:  [id number]  PCP:  Lamar Blinks, MD    Chief Complaint: Joint Pain   History of Present Illness:  Joyce Pittman is a 55 y.o. very pleasant female patient who presents with the following:  Here today with a concern about joint swelling and pain.   She had a right knee surgery in the fall -she did well but it then swelled up again in February. She saw Dr. French Ana and he put her back on meloxicam prn.  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 The next am her arms were both stiff and swollen.  It took about a week before her arms felt good again- they are still a bit sore now She used the meloxicam again which did seem to help.  However she notes that when she tries to stop the meloxicam her symptoms will return She continues to notice some swelling and pain of various joints that moves around to different joints Last week her left knee was very painful- it is better now  She has had occasional overuse injurues but nothing this persistent in the past.   Her PGF had rheumatoid arthritis No history of autoimmune disorder  She has a few hydrocodone left- uses them just prn Post menopausal   Patient Active Problem List   Diagnosis Date Noted  . History of muscle spasm 04/28/2013    Past Medical History  Diagnosis Date  . Dizziness     Past Surgical History  Procedure Laterality Date  . Appendectomy      Social History  Substance Use Topics  . Smoking status: Former Smoker -- 1.00 packs/day    Types: Cigarettes  . Smokeless tobacco: None  . Alcohol Use: Yes     Comment: social    Family History  Problem Relation Age of Onset  . Heart disease Father   . Hyperlipidemia Father      No Known Allergies  Medication list has been reviewed and updated.  Current Outpatient Prescriptions on File Prior to Visit  Medication Sig Dispense Refill  . Multiple Vitamin (MULITIVITAMIN WITH MINERALS) TABS Take 1 tablet by mouth daily.     No current facility-administered medications on file prior to visit.    Review of Systems:  As per HPI- otherwise negative.   Physical Examination: Filed Vitals:   05/06/15 1543  BP: 122/74  Pulse: 86  Temp: 98.6 F (37 C)   Filed Vitals:   05/06/15 1543  Height: '5\' 4"'$  (1.626 m)  Weight: 163 lb 6.4 oz (74.118 kg)   Body mass index is 28.03 kg/(m^2). Ideal Body Weight: Weight in (lb) to have BMI = 25: 145.3  GEN: WDWN, NAD, Non-toxic, A & O x 3, mild overweight, looks well HEENT: Atraumatic, Normocephalic. Neck supple. No masses, No LAD.  Bilateral TM wnl, oropharynx normal.  PEERL,EOMI.   Ears and Nose: No external deformity. CV: RRR, No M/G/R. No JVD. No thrill. No extra heart sounds. PULM: CTA B, no wheezes, crackles, rhonchi. No retractions. No resp. distress. No accessory muscle use. ABD: S, NT, ND, +BS. No rebound. No HSM. EXTR: No c/c/e NEURO Normal gait.  PSYCH: Normally interactive. Conversant. Not depressed  or anxious appearing.  Calm demeanor.  She still has minimal tenderness in her bilateral arms. Normal strength  No other joint swelling at this time    Assessment and Plan: Joint pain - Plan: cyclobenzaprine (FLEXERIL) 10 MG tablet, Sed Rate (ESR), C-reactive protein, Rheumatoid Factor, Cyclic citrul peptide antibody, IgG  Joint swelling - Plan: Sed Rate (ESR), C-reactive protein, Rheumatoid Factor  Forearm swelling - Plan: CK (Creatine Kinase)  Suspect that her joint sx are simply from overuse but will check labs to ensure no sign of RA Flexeril to use as needed Will plan further follow- up pending labs. See patient instructions for more details.     Signed Lamar Blinks, MD

## 2015-05-07 LAB — CK: Total CK: 41 U/L (ref 7–177)

## 2015-05-07 LAB — SEDIMENTATION RATE: SED RATE: 23 mm/h — AB (ref 0–22)

## 2015-05-07 LAB — C-REACTIVE PROTEIN: CRP: 0.3 mg/dL — AB (ref 0.5–20.0)

## 2015-05-07 LAB — CYCLIC CITRUL PEPTIDE ANTIBODY, IGG

## 2015-05-10 ENCOUNTER — Encounter: Payer: Self-pay | Admitting: Family Medicine

## 2015-05-11 ENCOUNTER — Telehealth: Payer: Self-pay | Admitting: Family Medicine

## 2015-05-11 NOTE — Telephone Encounter (Signed)
Relation to WU:JWJXpt:self Call back [phone]   Reason for call:  Patient inquiring about lab results

## 2015-05-12 NOTE — Telephone Encounter (Signed)
[facility], I sent a letter to her home

## 2015-06-03 ENCOUNTER — Telehealth: Payer: Self-pay | Admitting: Family Medicine

## 2015-06-03 NOTE — Telephone Encounter (Signed)
Can be reached: [phone] Pharmacy: [id number] [place]  - [address]  Reason for call: pt needing refill on meloxicam. She has 5 left. Takes 1/day.

## 2015-09-01 ENCOUNTER — Ambulatory Visit (INDEPENDENT_AMBULATORY_CARE_PROVIDER_SITE_OTHER): Payer: BLUE CROSS/BLUE SHIELD | Admitting: Family Medicine

## 2015-09-01 ENCOUNTER — Encounter: Payer: Self-pay | Admitting: Family Medicine

## 2015-09-01 ENCOUNTER — Ambulatory Visit (HOSPITAL_BASED_OUTPATIENT_CLINIC_OR_DEPARTMENT_OTHER)
Admission: RE | Admit: 2015-09-01 | Discharge: 2015-09-01 | Disposition: A | Discharge status: Home / Self Care | Payer: BLUE CROSS/BLUE SHIELD | Source: Ambulatory Visit | Attending: Family Medicine | Admitting: Family Medicine

## 2015-09-01 VITALS — BP 116/70 | HR 80 | Temp 98.2°F | Ht 64.0 in | Wt 162.6 lb

## 2015-09-01 DIAGNOSIS — M5136 Other intervertebral disc degeneration, lumbar region: Secondary | ICD-10-CM | POA: Insufficient documentation

## 2015-09-01 DIAGNOSIS — M545 Low back pain, unspecified: Secondary | ICD-10-CM

## 2015-09-01 DIAGNOSIS — I700 Atherosclerosis of aorta: Secondary | ICD-10-CM | POA: Insufficient documentation

## 2015-09-01 MED ORDER — PREDNISONE 20 MG PO TABS: ORAL_TABLET | ORAL | 0 refills | Status: DC

## 2015-09-01 MED ORDER — TRAMADOL HCL 50 MG PO TABS: 50.0000 mg | ORAL_TABLET | Freq: Three times a day (TID) | ORAL | 0 refills | Status: DC | PRN

## 2015-09-01 NOTE — Progress Notes (Signed)
[facility] at [facility] [address] [place], [place] [address] [phone]  Date:  09/01/2015   Name:  Joyce Pittman   DOB:  1960/02/19   MRN:  [id number]  PCP:  Abbe Amsterdam, MD    Chief Complaint: Back Pain (c/o lower back pain noticed more on left side x 2 weeks ago. Tylenol was helping with the pain but now paining is getting worse. Pain is noticed more when pt goes to sit down or stand up. )   History of Present Illness:  Joyce Pittman is a 55 y.o. very pleasant female patient who presents with the following:  Last seen by myself in April with knee pain; she ended up having a meniscal tear.  She had a scope done in January of 2017.  She is generally in good health  Here today with complaint of left lower back pain, like "a catch" when she is going from sit to standing or reaching up with her right hand.   She has noted this about 2 weeks ago- it was mild and responded to tylenol. It "locked down" about 4 days ago. 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  She had put out a lot of mulch prior to onset of the pain  The pain does not radiate into her leg. It stays generally low in her back but did go into the thoracic area last night as well.  No numbness or weakness of her legs, no bowel or bladder control No fever, chills, belly pain, dysuria or hematuria.  No urinary frequency or urine changes in general No bowel changes   She takes mobic for her knee- she also uses tylenol. She took a flexeril that she had but it did not help her.   NKDA No menses  Patient Active Problem List   Diagnosis Date Noted  . History of muscle spasm 04/28/2013    Past Medical History:  Diagnosis Date  . Dizziness     Past Surgical History:  Procedure Laterality Date  . APPENDECTOMY      Social  History  Substance Use Topics  . Smoking status: Former Smoker    Packs/day: 1.00    Types: Cigarettes  . Smokeless tobacco: Not on file  . Alcohol use Yes     Comment: social    Family History  Problem Relation Age of Onset  . Heart disease Father   . Hyperlipidemia Father     No Known Allergies  Medication list has been reviewed and updated.  Current Outpatient Prescriptions on File Prior to Visit  Medication Sig Dispense Refill  . meloxicam (MOBIC) 15 MG tablet Take 15 mg by mouth daily. with food  2   No current facility-administered medications on file prior to visit.     Review of Systems: No fever, chills, nausea, vomiting, weight loss, rashes, chest pain or sob As per HPI- otherwise negative.   Physical Examination:  Vitals:   09/01/15 1531  BP: 116/70  Pulse: 80  Temp: 98.2 F (36.8 C)    There were no vitals filed for this visit. Vitals:   09/01/15 1531  Weight: 162 lb 9.6 oz (73.8 kg)  Height: 5\' 4"  (1.626 m)   Body mass index is 27.91 kg/m. Ideal Body Weight: Weight in (lb) to have BMI =  25: 145.3  GEN: WDWN, NAD, Non-toxic, A & O x 3, looks well, healthy and robust HEENT: Atraumatic, Normocephalic. Neck supple. No masses, No LAD. Ears and Nose: No external deformity. CV: RRR, No M/G/R. No JVD. No thrill. No extra heart sounds. PULM: CTA B, no wheezes, crackles, rhonchi. No retractions. No resp. distress. No accessory muscle use. EXTR: No c/c/e NEURO Normal gait.  PSYCH: Normally interactive. Conversant. Not depressed or anxious appearing.  Calm demeanor.  No lesion or redness of her skin She notes the left paraspinous lumbar muscles are sore, but they are not really tender to the touch. 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 The lumbar vertebral bodies are preserved in height. There is mild disc space narrowing at L1-2 and at L2-3 and at L4-5. There is no spondylolisthesis. There are anterior and lateral endplate osteophytes at multiple levels. The pedicles and transverse processes are intact where visualized. There is facet joint hypertrophy at L4-5 and L5-S1. The observed portions of the sacrum are normal. The bowel gas pattern is unremarkable. No urinary tract stones are observed. There is calcification in the wall of the abdominal aorta. IMPRESSION: Mild multilevel degenerative disc space narrowing with endplate osteophyte formation. Facet joint hypertrophy at L4-5 and L5-S1. The findings are consistent with osteoarthritis. There is no acute bony abnormality. Aortic atherosclerosis. Electronically Signed   By: David  [place] M.D.   On: 09/01/2015 16:40   Called pt on 8/2 and went over results as above Assessment and Plan: Left-sided low back pain without sciatica - Plan: DG Lumbar Spine Complete, traMADol (ULTRAM) 50 MG tablet, predniSONE (DELTASONE) 20 MG tablet  Here today with left sided lower back pain- no discrete cause but she is very physically active.  Flexeril did not help. Will get plain films, try tramadol. If this does not help can add prednisone after a couple of days She will let me know if her back if not improved soon- Sooner if worse.     Signed Abbe Amsterdam, MD

## 2015-09-01 NOTE — Patient Instructions (Signed)
We are gong to take x-rays of your lower back today- I will let you know how these look.  You may have some arthritis in your back.  We will try tramadol for pain- take 1 every 8 hours as needed but remember these will make you sleepy. Ok to use with mobic and/ or tylenol If you are not improving with this fill and use the prednisone rx that I gave you on paper.  If you are taking the prednisone do NOT use mobic (or aleve, ibuprofen) while you are using prednisone

## 2015-09-01 NOTE — Progress Notes (Signed)
Pre visit review using our clinic review tool, if applicable. No additional management support is needed unless otherwise documented below in the visit note. 

## 2015-09-15 ENCOUNTER — Telehealth: Payer: Self-pay | Admitting: Family Medicine

## 2015-09-15 NOTE — Telephone Encounter (Signed)
Relation to HQ:IONGpt:self Call back number:[phone] Pharmacy:  Reason for call:  Patient was advised to follow up with PCP regarding predniSONE (DELTASONE) 20 MG tablet and patient states it works.

## 2015-09-16 NOTE — Telephone Encounter (Signed)
Called and [facility]- thanks for the update, let me know if symptoms return

## 2018-02-09 IMAGING — DX DG LUMBAR SPINE COMPLETE 4+V
5 series · 5 of 5 positions shown · non-contrast
Comparison: None in PACs

CLINICAL DATA: Two weeks of low back pain without known injury

EXAM:
LUMBAR SPINE - COMPLETE 4+ VIEW

[l-spine ap]
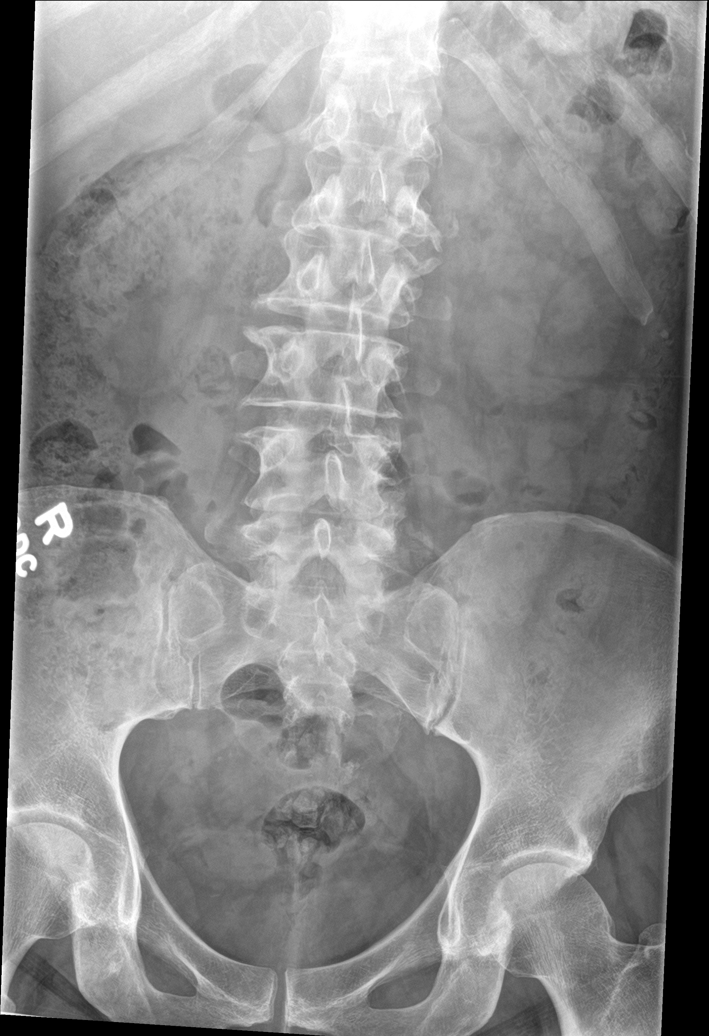

[l-spine obl (1 of 2)]
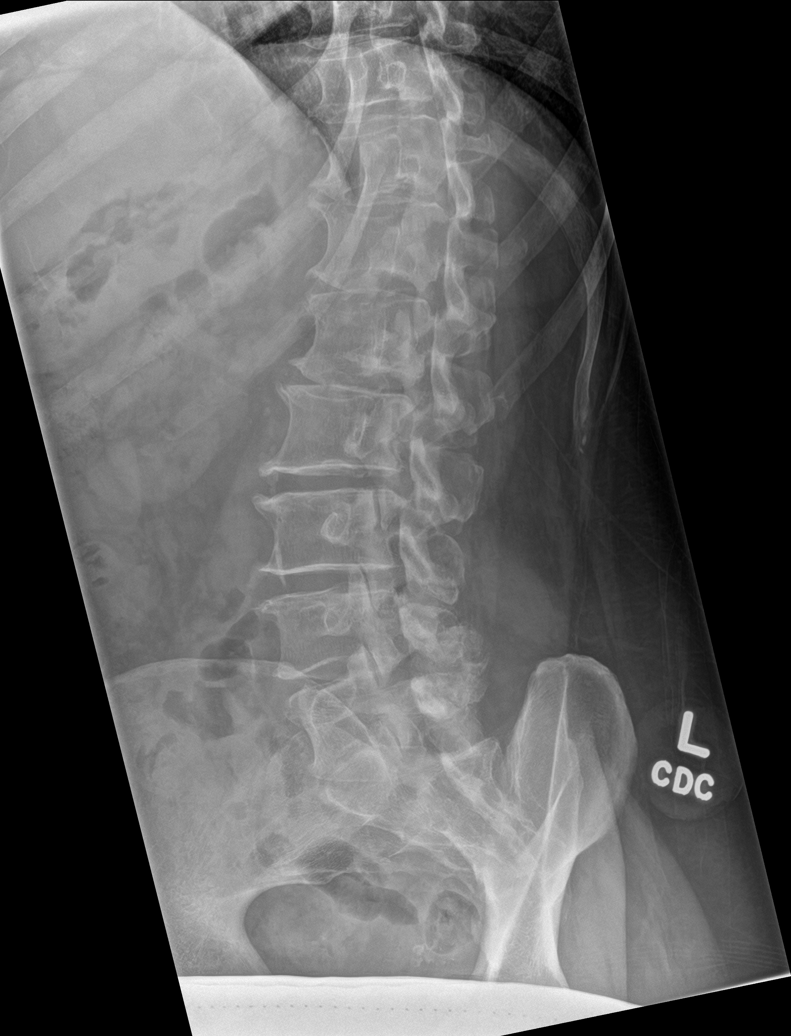

[l-spine obl (2 of 2)]
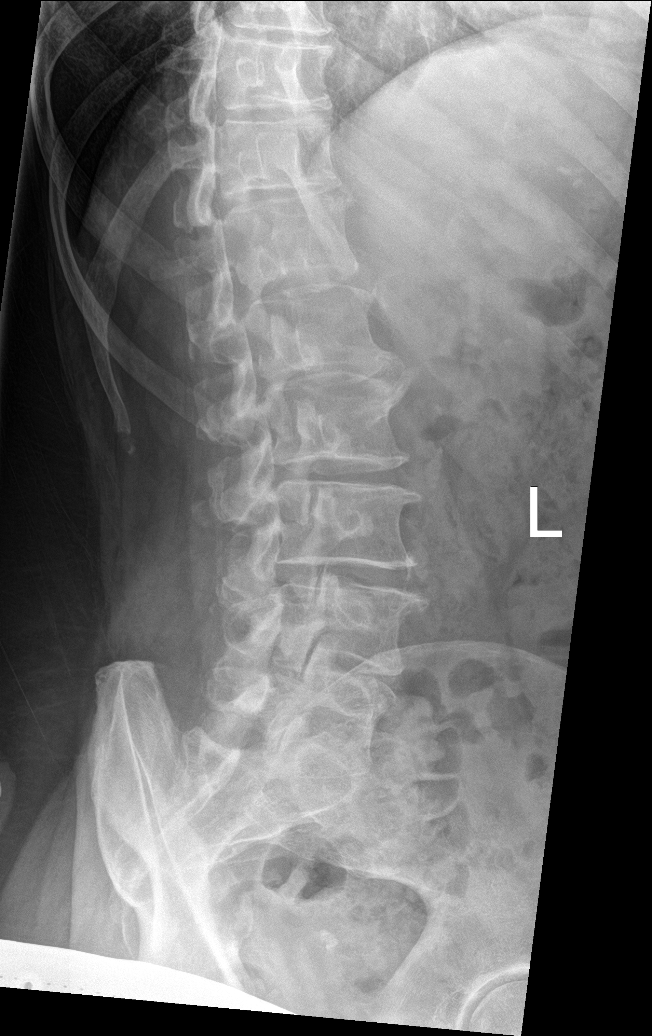

[l-spine lat]
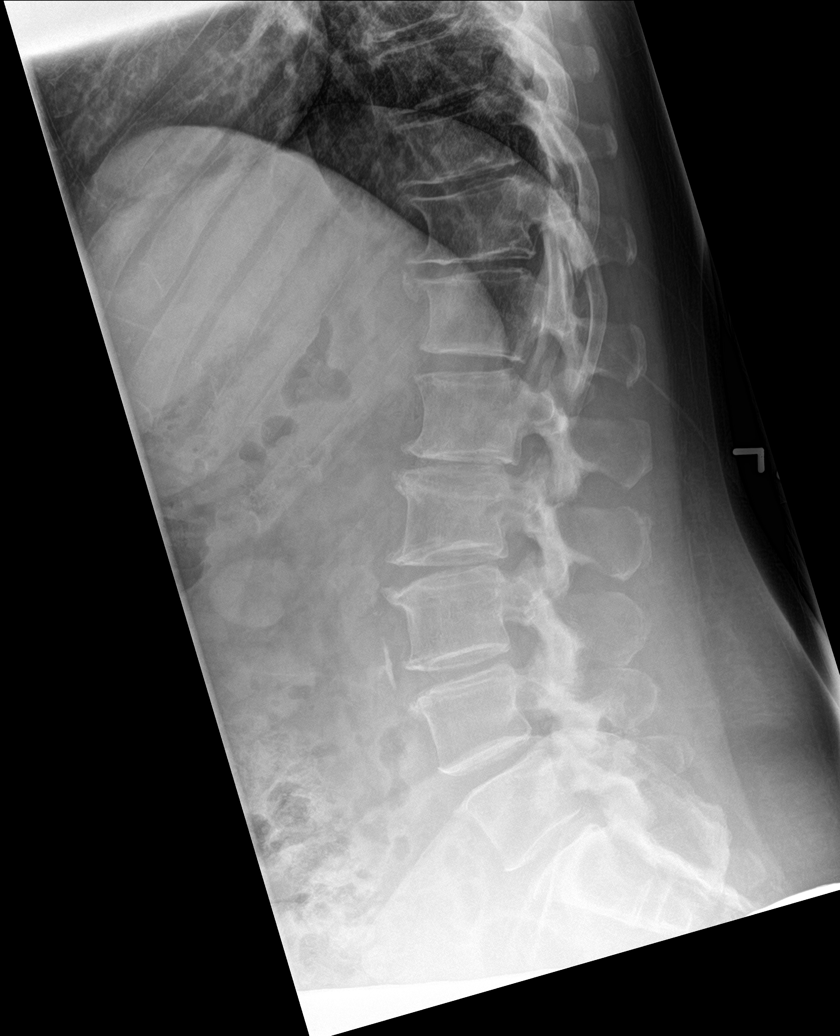

[l-spine spot]
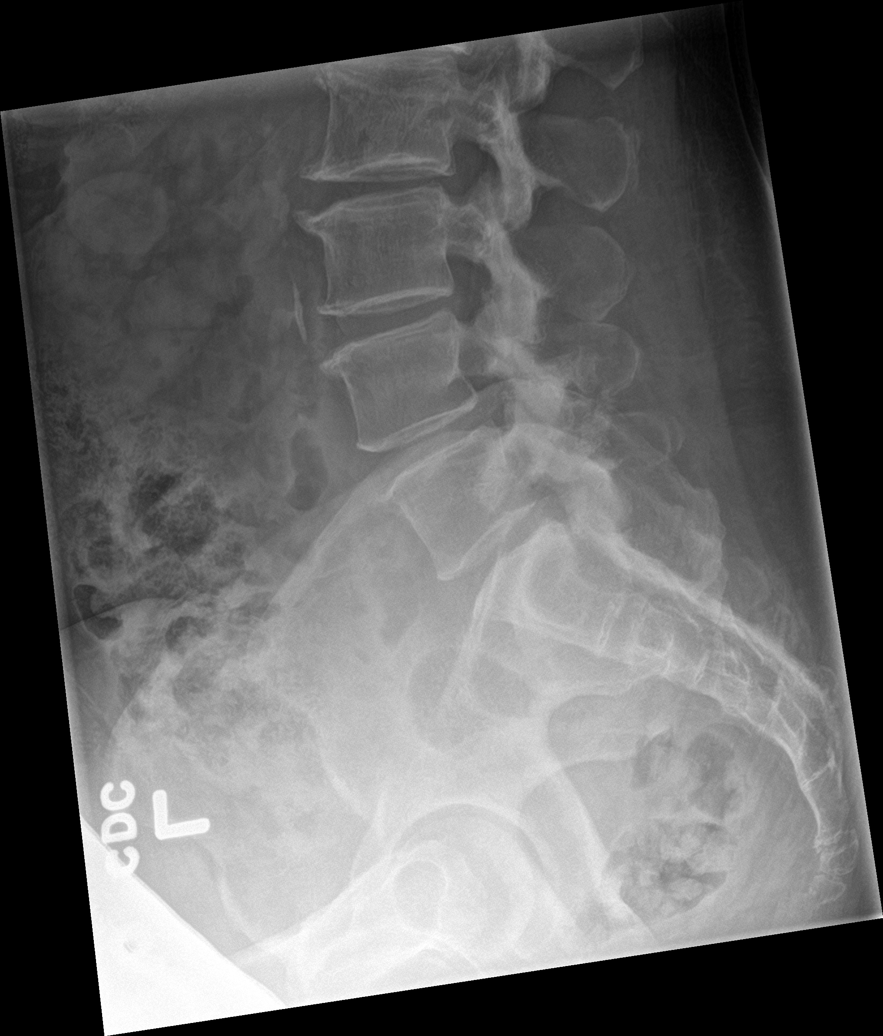

[5 of 5 positions shown; findings below may reference images not displayed]

FINDINGS: The twelfth ribs are assumed to be hypoplastic. The lumbar vertebral
bodies are preserved in height. There is mild disc space narrowing
at L1-2 and at L2-3 and at L4-5. There is no spondylolisthesis.
There are anterior and lateral endplate osteophytes at multiple
levels. The pedicles and transverse processes are intact where
visualized. There is facet joint hypertrophy at L4-5 and L5-S1. The
observed portions of the sacrum are normal.

The bowel gas pattern is unremarkable. No urinary tract stones are
observed. There is calcification in the wall of the abdominal aorta.
IMPRESSION: Mild multilevel degenerative disc space narrowing with endplate
osteophyte formation. Facet joint hypertrophy at L4-5 and L5-S1. The
findings are consistent with osteoarthritis. There is no acute bony
abnormality.

Aortic atherosclerosis.

## 2021-08-01 LAB — COLOGUARD: COLOGUARD: NEGATIVE

## 2021-08-18 ENCOUNTER — Encounter: Payer: Self-pay | Admitting: *Deleted

## 2021-10-20 ENCOUNTER — Ambulatory Visit (INDEPENDENT_AMBULATORY_CARE_PROVIDER_SITE_OTHER): Payer: BC Managed Care – PPO | Admitting: Pulmonary Disease

## 2021-10-20 ENCOUNTER — Encounter: Payer: Self-pay | Admitting: Pulmonary Disease

## 2021-10-20 VITALS — BP 112/70 | HR 73 | Ht 64.5 in | Wt 161.4 lb

## 2021-10-20 DIAGNOSIS — Z720 Tobacco use: Secondary | ICD-10-CM | POA: Diagnosis not present

## 2021-10-20 DIAGNOSIS — R918 Other nonspecific abnormal finding of lung field: Secondary | ICD-10-CM | POA: Diagnosis not present

## 2021-10-20 LAB — BASIC METABOLIC PANEL
BUN: 17 mg/dL (ref 6–23)
CO2: 28 mEq/L (ref 19–32)
Calcium: 9.5 mg/dL (ref 8.4–10.5)
Chloride: 106 mEq/L (ref 96–112)
Creatinine, Ser: 0.65 mg/dL (ref 0.40–1.20)
GFR: 96.52 mL/min (ref >60.00)
Glucose, Bld: 98 mg/dL (ref 70–99)
Potassium: 4.3 mEq/L (ref 3.5–5.1)
Sodium: 139 mEq/L (ref 135–145)

## 2021-10-20 NOTE — Progress Notes (Signed)
Subjective:   PATIENT ID: [name] GENDER: female DOB: Jun 21, 1960, MRN: [id number]   HPI  Chief Complaint  Patient presents with   Consult    Abnormal CT   Reason for Visit: New consult for abnormal CT  Ms. Joyce Pittman is a 61 year old female active smoker with no significant past medical history who presents for abnormal CT.  She recently had routine lung screening completed.  CT 08/11/21 - Right upper lobe nodule 5 mm and LUL nodule 5 mm.  Numerous upper lobe predominant nodular opacities present.  She started smoking at 61 years old. Smokes 1/2 ppd x 33 years. No MJ use. 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 Denies shortness of breath, cough, wheezing. Denies fevers, chills, weight loss or night sweats. Denies any respiratory illness in the last few months. Has some allergies.   Social History: Copy for parents  I have personally reviewed patient's past medical/family/social history, allergies, current medications.  Past Medical History:  Diagnosis Date   Dizziness      Family History  Problem Relation Age of Onset   Heart disease Father    Hyperlipidemia Father      Social History   Occupational History   Not on file  Tobacco Use   Smoking status: Every Day    Packs/day: 0.50    Years: 43.00    Total pack years: 21.50    Types: Cigarettes   Smokeless tobacco: Not on file  Substance and Sexual Activity   Alcohol use: Yes    Comment: social   Drug use: No   Sexual activity: Yes    Birth control/protection: Pill    No Known Allergies   Outpatient Medications Prior to Visit  Medication Sig Dispense Refill   Ascorbic Acid (VITAMIN C) 100 MG tablet Take 100 mg by mouth daily.     b complex vitamins tablet Take 1 tablet by mouth daily.     Cholecalciferol (VITAMIN D3) 10 MCG (400 UNIT) CAPS Take by  mouth.     meloxicam (MOBIC) 15 MG tablet Take 15 mg by mouth daily. with food  2   predniSONE (DELTASONE) 20 MG tablet Take 2 pills a day for 4 days, then 1 pill a day for 4 days 12 tablet 0   traMADol (ULTRAM) 50 MG tablet Take 1 tablet (50 mg total) by mouth every 8 (eight) hours as needed. 30 tablet 0   No facility-administered medications prior to visit.    Review of Systems  Constitutional:  Negative for chills, diaphoresis, fever, malaise/fatigue and weight loss.  HENT:  Negative for congestion.   Respiratory:  Negative for cough, hemoptysis, sputum production, shortness of breath and wheezing.   Cardiovascular:  Negative for chest pain, palpitations and leg swelling.     Objective:   Vitals:   10/20/21 0853  BP: 112/70  Pulse: 73  SpO2: 99%  Weight: 161 lb 6.4 oz (73.2 kg)  Height: 5' 4.5" (1.638 m)   SpO2: 99 % O2 Device: None (Room air)  Physical Exam: General: Well-appearing, no acute distress HENT: [place], AT Eyes: EOMI, no scleral icterus Respiratory: Clear to auscultation bilaterally.  No crackles, wheezing or rales Cardiovascular: RRR, -M/R/G, no JVD Extremities:-Edema,-tenderness Neuro: AAO x4, CNII-XII grossly intact Psych: Normal mood, normal affect  Data Reviewed:  Imaging: CT 08/11/21 - Right upper lobe nodule 5 mm and LUL nodule 5 mm.  Numerous upper lobe predominant nodular opacities present.  PFT: None on file  Labs: CBC    Component Value Date/Time   WBC 6.4 04/28/2013 0926   RBC 4.30 04/28/2013 0926   HGB 13.9 04/28/2013 0926   HCT 40.3 04/28/2013 0926   PLT 225 04/28/2013 0926   MCV 93.7 04/28/2013 0926   MCH 32.3 04/28/2013 0926   MCHC 34.5 04/28/2013 0926   RDW 13.0 04/28/2013 0926   LYMPHSABS 1.5 04/28/2013 0926   MONOABS 0.4 04/28/2013 0926   EOSABS 0.1 04/28/2013 0926   BASOSABS 0.0 04/28/2013 0926   BMET    Component Value Date/Time   NA 138 04/28/2013 0926   K 5.0 04/28/2013 0926   CL 107 04/28/2013 0926   CO2 24  04/28/2013 0926   GLUCOSE 97 04/28/2013 0926   BUN 12 04/28/2013 0926   CREATININE 0.72 04/28/2013 0926   CALCIUM 9.4 04/28/2013 0926   GFRNONAA >90 05/06/2011 0427   CBC and BMET reviewed and normal     Assessment & Plan:   Discussion: 61 year old female former smoker with no significant past medical history who presents for abnormal CT. CT lung screening report from 08/11/2021 reviewed.  No available imaging to personally review.  The described subcentimeter nodules likely low risk but would warrant follow-up imaging based on smoking history.  Regarding nodular opacities, likely infectious or inflammatory etiology.  Is due for repeat imaging next month. Addressed questions and concerns regarding surveillance imaging and diagnostic testing including bronchoscopy if indicated.  Nodular opacities Subcentimeter nodules, RUL 44mm, LUL nodule 51mm --ORDER CT Chest with contrast for October 13 or after (prefers Thursdays) --BMET --I will call you for results. If needed, I will schedule appointment  Tobacco abuse Patient is an active smoker. Discussed cold [place], nicotine patches, lozenges and gum. She failed wellbutrin We discussed smoking cessation for 5 minutes. We discussed triggers and stressors and ways to deal with them. We discussed barriers to continued smoking and benefits of smoking cessation. Provided patient with information cessation techniques and interventions including [place] quitline.   Health Maintenance Immunization History  Administered Date(s) Administered   PNEUMOCOCCAL CONJUGATE-20 07/14/2021   Tdap 04/28/2013   CT Lung Screen - enrolled  Orders Placed This Encounter  Procedures   CT Chest W Contrast    Standing Status:   Future    Standing Expiration Date:   10/21/2022    Scheduling Instructions:     Prefers Thursday and after 11/11/21    Order Specific Question:   If indicated for the ordered procedure, I authorize the administration of contrast media per  Radiology protocol    Answer:   Yes    Order Specific Question:   Is patient pregnant?    Answer:   No    Order Specific Question:   Preferred imaging location?    Answer:   [facility]   Basic Metabolic Panel (BMET)    Standing Status:   Future    Number of Occurrences:   1    Standing Expiration Date:   10/20/2022  No orders of the defined types were placed in this encounter.   No follow-ups on file.  I have spent a total time of 45-minutes on the day of the appointment reviewing prior documentation, coordinating care and discussing medical diagnosis and plan with the patient/family. Imaging, labs and tests included in this note have been reviewed and interpreted independently by me.  [place], MD [facility] 10/20/2021 10:13 AM  Office  Number [phone]

## 2021-10-20 NOTE — Patient Instructions (Addendum)
   Nodular opacities Subcentimeter nodules, RUL 32mm, LUL nodule 70mm --ORDER CT Chest with contrast for October 13 or after (prefers Thursdays) --BMET --I will call you for results. If needed, I will schedule appointment  Tobacco abuse Patient is an active smoker. Discussed cold [place], nicotine patches, lozenges and gum. She failed wellbutrin We discussed smoking cessation for 5 minutes. We discussed triggers and stressors and ways to deal with them. We discussed barriers to continued smoking and benefits of smoking cessation. Provided patient with information cessation techniques and interventions including [place] quitline.  Follow-up with me pending test results

## 2021-10-27 ENCOUNTER — Telehealth: Payer: Self-pay | Admitting: Pulmonary Disease

## 2021-11-02 NOTE — Telephone Encounter (Signed)
Yes- you can send a message to patient or call. Thanks for helping!

## 2021-11-17 ENCOUNTER — Ambulatory Visit
Admission: RE | Admit: 2021-11-17 | Discharge: 2021-11-17 | Disposition: A | Discharge status: Home / Self Care | Payer: BC Managed Care – PPO | Source: Ambulatory Visit | Attending: Pulmonary Disease | Admitting: Pulmonary Disease

## 2021-11-17 ENCOUNTER — Other Ambulatory Visit (HOSPITAL_BASED_OUTPATIENT_CLINIC_OR_DEPARTMENT_OTHER): Payer: BC Managed Care – PPO

## 2021-11-17 DIAGNOSIS — R918 Other nonspecific abnormal finding of lung field: Secondary | ICD-10-CM

## 2021-11-17 MED ORDER — IOPAMIDOL (ISOVUE-300) INJECTION 61%
75.0000 mL | Freq: Once | INTRAVENOUS | Status: AC | PRN
  Administered 2021-11-17: 75 mL via INTRAVENOUS

## 2021-11-23 ENCOUNTER — Telehealth: Payer: Self-pay | Admitting: Pulmonary Disease

## 2021-11-23 DIAGNOSIS — R918 Other nonspecific abnormal finding of lung field: Secondary | ICD-10-CM

## 2021-11-23 NOTE — Telephone Encounter (Signed)
[facility] Telephone Results  Discussed CT 11/17/21 with multiple subcentimeter nodules.  CT Chest Lung Screen 08/11/21 still not available to review imaging.  Based on available CT results, recommend follow-up in 3 months with me to review CT Chest.  Plan ORDERED CT Chest without contrast in 3 months (end of January) Follow-up with me in 3 months (end of Jan/Feb)  >[place], please place recall  Rodman Pickle, M.D. [facility] Medicine 11/23/2021 3:48 PM

## 2022-03-01 ENCOUNTER — Other Ambulatory Visit: Payer: BC Managed Care – PPO

## 2022-05-11 ENCOUNTER — Inpatient Hospital Stay: Admission: RE | Admit: 2022-05-11 | Payer: BC Managed Care – PPO | Source: Ambulatory Visit

## 2022-06-03 ENCOUNTER — Emergency Department (HOSPITAL_BASED_OUTPATIENT_CLINIC_OR_DEPARTMENT_OTHER)
Admission: EM | Admit: 2022-06-03 | Discharge: 2022-06-03 | Disposition: A | Discharge status: Home / Self Care | Payer: BC Managed Care – PPO | Attending: Emergency Medicine | Admitting: Emergency Medicine

## 2022-06-03 ENCOUNTER — Emergency Department (HOSPITAL_BASED_OUTPATIENT_CLINIC_OR_DEPARTMENT_OTHER): Payer: BC Managed Care – PPO

## 2022-06-03 ENCOUNTER — Other Ambulatory Visit: Payer: Self-pay

## 2022-06-03 ENCOUNTER — Encounter (HOSPITAL_BASED_OUTPATIENT_CLINIC_OR_DEPARTMENT_OTHER): Payer: Self-pay | Admitting: Emergency Medicine

## 2022-06-03 DIAGNOSIS — R079 Chest pain, unspecified: Secondary | ICD-10-CM | POA: Diagnosis present

## 2022-06-03 DIAGNOSIS — R200 Anesthesia of skin: Secondary | ICD-10-CM | POA: Diagnosis not present

## 2022-06-03 DIAGNOSIS — R0602 Shortness of breath: Secondary | ICD-10-CM | POA: Diagnosis not present

## 2022-06-03 DIAGNOSIS — I4510 Unspecified right bundle-branch block: Secondary | ICD-10-CM | POA: Insufficient documentation

## 2022-06-03 DIAGNOSIS — R202 Paresthesia of skin: Secondary | ICD-10-CM | POA: Insufficient documentation

## 2022-06-03 LAB — BASIC METABOLIC PANEL
Anion gap: 8 (ref 5–15)
BUN: 11 mg/dL (ref 6–20)
CO2: 23 mmol/L (ref 22–32)
Calcium: 9.4 mg/dL (ref 8.9–10.3)
Chloride: 107 mmol/L (ref 98–111)
Creatinine, Ser: 0.62 mg/dL (ref 0.44–1.00)
GFR, Estimated: >60 mL/min (ref >60)
Glucose, Bld: 86 mg/dL (ref 70–99)
Potassium: 4 mmol/L (ref 3.5–5.1)
Sodium: 138 mmol/L (ref 135–145)

## 2022-06-03 LAB — CBC
HCT: 43.4 % (ref 36.0–46.0)
Hemoglobin: 14.8 g/dL (ref 12.0–15.0)
MCH: 32.5 pg (ref 26.0–34.0)
MCHC: 34.1 g/dL (ref 30.0–36.0)
MCV: 95.2 fL (ref 80.0–100.0)
Platelets: 233 10*3/uL (ref 150–400)
RBC: 4.56 MIL/uL (ref 3.87–5.11)
RDW: 13 % (ref 11.5–15.5)
WBC: 7.7 10*3/uL (ref 4.0–10.5)
nRBC: 0 % (ref 0.0–0.2)

## 2022-06-03 LAB — TROPONIN I (HIGH SENSITIVITY)
Troponin I (High Sensitivity): <2 ng/L (ref <18)
Troponin I (High Sensitivity): <2 ng/L (ref <18)

## 2022-06-03 LAB — D-DIMER, QUANTITATIVE: D-Dimer, Quant: 1.03 ug/mL-FEU — ABNORMAL HIGH (ref 0.00–0.50)

## 2022-06-03 MED ORDER — ASPIRIN 81 MG PO CHEW
162.0000 mg | CHEWABLE_TABLET | Freq: Once | ORAL | Status: AC
  Administered 2022-06-03: 162 mg via ORAL
  Filled 2022-06-03: qty 2

## 2022-06-03 MED ORDER — IOHEXOL 350 MG/ML SOLN
100.0000 mL | Freq: Once | INTRAVENOUS | Status: AC | PRN
  Administered 2022-06-03: 80 mL via INTRAVENOUS

## 2022-06-03 NOTE — ED Provider Notes (Signed)
[place] EMERGENCY DEPARTMENT AT [facility] Provider Note   CSN: [id number] Arrival date & time: 06/03/22  1137     History  Chief Complaint  Patient presents with   Shortness of Breath    Joyce Pittman is a 62 y.o. female.  Patient presents with chest pain that radiates to her mid back associated with shortness of breath.  Pain onset about midnight last night while she was lying in bed.  She describes pain and pressure to her central chest that goes to her mid upper back associated with arm numbness on the left side as well as leg numbness on the left side.  He is like a "tightening" that gets sharp at times but eases off.  Does go away completely at times but has not since about midnight.  She has had 2 or 3 of these episodes in the past several weeks.  Unclear what is causing them.  Pain is not with exertion and is not pleuritic.  She denies any cardiac history but has never had a stress test or catheterization.  She uses tobacco but denies any regular medication use.  No history of hypertension or diabetes or hyperlipidemia.  When the chest pain and back pain, and she has numbness to her left arm and her left leg that lasted for several hours at a time.  No weakness.  No facial droop.  No significant headache.  No difficulty speaking or difficulty swallowing.  No fevers or chills.  The history is provided by the patient.  Shortness of Breath Associated symptoms: no abdominal pain, no chest pain, no cough, no rash and no vomiting        Home Medications Prior to Admission medications   Medication Sig Start Date End Date Taking? Authorizing Provider  Ascorbic Acid (VITAMIN C) 100 MG tablet Take 100 mg by mouth daily.    [provider]  b complex vitamins tablet Take 1 tablet by mouth daily.    [provider]  Cholecalciferol (VITAMIN D3) 10 MCG (400 UNIT) CAPS Take by mouth.    [provider]      Allergies    Patient has no known  allergies.    Review of Systems   Review of Systems  Constitutional:  Negative for activity change and appetite change.  HENT:  Negative for congestion.   Respiratory:  Positive for chest tightness and shortness of breath. Negative for cough.   Cardiovascular:  Negative for chest pain.  Gastrointestinal:  Negative for abdominal pain, nausea and vomiting.  Genitourinary:  Negative for dysuria.  Musculoskeletal:  Negative for arthralgias and myalgias.  Skin:  Negative for rash.  Neurological:  Negative for weakness and numbness.    all other systems are negative except as noted in the HPI and PMH.   Physical Exam Updated Vital Signs BP (!) 152/66 (BP Location: Left Arm)   Pulse 98   Temp 97.7 F (36.5 C) (Axillary)   Resp 18   LMP 03/24/2014   SpO2 100%  Physical Exam Vitals and nursing note reviewed.  Constitutional:      General: She is not in acute distress.    Appearance: She is well-developed.  HENT:     Head: Normocephalic and atraumatic.     Mouth/Throat:     Pharynx: No oropharyngeal exudate.  Eyes:     Conjunctiva/sclera: Conjunctivae normal.     Pupils: Pupils are equal, round, and reactive to light.  Neck:     Comments: No meningismus.  Cardiovascular:     Rate and Rhythm: Normal rate and regular rhythm.     Heart sounds: Normal heart sounds. No murmur heard. Pulmonary:     Effort: Pulmonary effort is normal. No respiratory distress.     Breath sounds: Normal breath sounds.  Abdominal:     Palpations: Abdomen is soft.     Tenderness: There is no abdominal tenderness. There is no guarding or rebound.  Musculoskeletal:        General: No tenderness. Normal range of motion.     Cervical back: Normal range of motion and neck supple.     Comments: Equal radial, DP and PT pulses bilaterally  Skin:    General: Skin is warm.  Neurological:     Mental Status: She is alert and oriented to person, place, and time.     Cranial Nerves: No cranial nerve deficit.      Motor: No abnormal muscle tone.     Coordination: Coordination normal.     Comments:  5/5 strength throughout. CN 2-12 intact.Equal grip strength.   Psychiatric:        Behavior: Behavior normal.     ED Results / Procedures / Treatments   Labs (all labs ordered are listed, but only abnormal results are displayed) Labs Reviewed  D-DIMER, QUANTITATIVE (NOT AT [facility]) - Abnormal; Notable for the following components:      Result Value   D-Dimer, Quant 1.03 (*)    All other components within normal limits  BASIC METABOLIC PANEL  CBC  TROPONIN I (HIGH SENSITIVITY)  TROPONIN I (HIGH SENSITIVITY)    EKG EKG Interpretation  Date/Time:  Saturday Jun 03 2022 11:48:15 EDT Ventricular Rate:  80 PR Interval:  162 QRS Duration: 132 QT Interval:  420 QTC Calculation: 484 R Axis:   110 Text Interpretation: Unusual P axis, possible ectopic atrial rhythm Right bundle branch block Abnormal ECG When compared with ECG of 06-May-2011 01:03, Criteria for Septal infarct are no longer Present No significant change was found Confirmed by Glynn Octave [phone]) on 06/03/2022 12:05:28 PM  Radiology CT Angio Chest/Abd/Pel for Dissection W and/or Wo Contrast  Result Date: 06/03/2022 CLINICAL DATA:  Patient presents with shortness of breath, back pain, numbness of the left arm and leg. Also with dizziness upon standing. EXAM: CT ANGIOGRAPHY CHEST, ABDOMEN AND PELVIS TECHNIQUE: Non-contrast CT of the chest was initially obtained. Multidetector CT imaging through the chest, abdomen and pelvis was performed using the standard protocol during bolus administration of intravenous contrast. Multiplanar reconstructed images and MIPs were obtained and reviewed to evaluate the vascular anatomy. RADIATION DOSE REDUCTION: This exam was performed according to the departmental dose-optimization program which includes automated exposure control, adjustment of the mA and/or kV according to patient size and/or use of iterative  reconstruction technique. CONTRAST:  80mL OMNIPAQUE IOHEXOL 350 MG/ML SOLN COMPARISON:  Chest CT, 11/17/2021. FINDINGS: CTA CHEST FINDINGS Cardiovascular: Thoracic aorta is normal in caliber. No dissection. Mild atherosclerosis. No stenosis. Arch branch vessels are widely patent. Heart normal in size and configuration. No coronary artery calcifications. No pericardial effusion. Pulmonary arteries are relatively well opacified. No evidence of a pulmonary embolism. Mediastinum/Nodes: No neck base, mediastinal or hilar masses or enlarged lymph nodes. Trachea esophagus are unremarkable. Lungs/Pleura: Clear lungs. Minimal emphysema in the upper lobes near the apices. No pleural effusion or pneumothorax. Musculoskeletal: No fracture or acute finding. No bone lesion. No chest wall mass. Review of the MIP images confirms the above findings. CTA ABDOMEN AND PELVIS FINDINGS VASCULAR  Aorta: Mild atherosclerosis. No dissection. No aneurysm. No significant narrowing. Celiac: Patent without evidence of aneurysm, dissection, vasculitis or significant stenosis. SMA: Patent without evidence of aneurysm, dissection, vasculitis or significant stenosis. Renals: Dual right and single left renal arteries. Arteries are widely patent. No aneurysm, dissection or vasculitis. IMA: Small but patent. Inflow: Patent without evidence of aneurysm, dissection, vasculitis or significant stenosis. Veins: Choose 1 Review of the MIP images confirms the above findings. NON-VASCULAR Hepatobiliary: No focal liver abnormality is seen. No gallstones, gallbladder wall thickening, or biliary dilatation. Pancreas: Unremarkable. No pancreatic ductal dilatation or surrounding inflammatory changes. Spleen: Normal in size without focal abnormality. Adrenals/Urinary Tract: Adrenal glands are unremarkable. Kidneys are normal, without renal calculi, focal lesion, or hydronephrosis. Bladder is unremarkable. Stomach/Bowel: Normal stomach. Small bowel and colon are  normal in caliber. No wall thickening. No inflammation. Lymphatic: No enlarged lymph nodes. Reproductive: Partly calcified uterine fibroids. No adnexal masses. Other: No abdominal wall hernia or abnormality. No abdominopelvic ascites. Musculoskeletal: No fracture or acute finding.  No bone lesion. Review of the MIP images confirms the above findings. IMPRESSION: CTA 1. Normal caliber thoracoabdominal aorta. No dissection. No evidence of acute aortic syndrome. 2. Aortic atherosclerosis with no significant stenosis. Aortic branch vessels are all widely patent. NON CTA 1. No acute findings within the chest, abdomen or pelvis. Electronically Signed   By: Amie Portland M.D.   On: 06/03/2022 13:48   CT Head Wo Contrast  Result Date: 06/03/2022 CLINICAL DATA:  Acute neurologic deficit. Dizziness. Left upper and lower extremity numbness and pain. EXAM: CT HEAD WITHOUT CONTRAST TECHNIQUE: Contiguous axial images were obtained from the base of the skull through the vertex without intravenous contrast. RADIATION DOSE REDUCTION: This exam was performed according to the departmental dose-optimization program which includes automated exposure control, adjustment of the mA and/or kV according to patient size and/or use of iterative reconstruction technique. COMPARISON:  05/06/2011. FINDINGS: Brain: No evidence of intracranial hemorrhage, acute infarction, hydrocephalus, extra-axial collection, or mass lesion/mass effect. Vascular:  No hyperdense vessel or other acute findings. Skull: No evidence of fracture or other significant bone abnormality. Sinuses/Orbits:  No acute findings. Other: None. IMPRESSION: Negative noncontrast head CT. Electronically Signed   By: Danae Orleans M.D.   On: 06/03/2022 13:38   DG Chest Port 1 View  Result Date: 06/03/2022 CLINICAL DATA:  62 year old female with shortness of breath, back pain, left extremity numbness. EXAM: PORTABLE CHEST 1 VIEW COMPARISON:  CTA chest 11/17/2021. FINDINGS: Portable  AP upright view at 1214 hours. Stable borderline cardiomegaly. Other mediastinal contours are within normal limits. Visualized tracheal air column is within normal limits. Normal lung volumes. Allowing for portable technique the lungs are clear. No pneumothorax or pleural effusion. No acute osseous abnormality identified. Negative visible bowel gas. IMPRESSION: Cardiac size at the upper limits of normal. Otherwise Negative portable chest. Electronically Signed   By: Odessa Fleming M.D.   On: 06/03/2022 12:22    Procedures Procedures    Medications Ordered in ED Medications  aspirin chewable tablet 162 mg (has no administration in time range)    ED Course/ Medical Decision Making/ A&P Clinical Course as of 06/03/22 1624  Sat Jun 03, 2022  1525 Stable 61yof with a chief complaint of chest pain to her back and chest Continued EKG has a RBBB  Pwaves Troponin and DDIMER negative HS 5 Discussed [CC]  1543 I was called to bedside.  Patient was admitted by the prior provider but patient is now choosing to leave  the hospital.  She states that her chest pain has resolved, she discussed it with her family and she does not want to be admitted this weekend.  Heart score is elevated but symptoms are resolved troponins are negative and CTA is nondiagnostic.  Given well appearance normal vitals I believe this is reasonable.  I discussed risk for missed cardiac event and patient expressed understanding of possibility of adverse cardiac event in the next 30 days given her elevated heart score.  She still is electing for outpatient care and management. [CC]    Clinical Course User Index [CC] Glyn Ade, MD                             Medical Decision Making Amount and/or Complexity of Data Reviewed Labs: ordered. Decision-making details documented in ED Course. Radiology: ordered and independent interpretation performed. Decision-making details documented in ED Course. ECG/medicine tests: ordered and  independent interpretation performed. Decision-making details documented in ED Course.  Risk OTC drugs. Prescription drug management.   Intermittent central chest pain that radiates to the back associated with shortness of breath and numbness and tingling to the left side.  EKG shows right bundle branch block, no acute ischemia.  Neurological exam is nonfocal.  No weakness currently. She has had 4-5 episodes of this pain within the past 2 weeks, usually at night. Not necessarily exertional or pleuritic.   ASA given. Troponin negative. CXR negative for infiltrate or pneumothorax. Results reviewed and interpreted by me.  D-dimer elevated.   Heart score is 4-5. No previous cardiac testing. EKG is abnormal but unchanged for her.  It is unusual for her to have both L arm and L numbness with the episodes of chest pain. CTA was obtained to evaluate for pulmonary embolism or aortic dissection. This was negative. Results reviewed and interpreted by me.  Recommend admission to hospital for provocative testing given recurrent episodes of chest pain and elevated heart score.  Patient hesitant but eventually agreed for observation admission. May consider MRI of brain as well given recurrent episodes of L sided numbness.   Dw/ Dr. Ophelia Charter.           Final Clinical Impression(s) / ED Diagnoses Final diagnoses:  None    Rx / DC Orders ED Discharge Orders     None         Norval Slaven, Jeannett Senior, MD 06/03/22 1630

## 2022-06-03 NOTE — ED Triage Notes (Signed)
Pt presents to ED POV. Pt c/o SOB, back pain, numbness on her L arm and L leg. Pt reports pain the pain is intermittent, begins tightening and then gets sharp and eases up. Pt also c/o dizziness upon standing. Pt reports that she had similar episode 2w ago and then it began again around 0200 this morning.

## 2022-06-03 NOTE — Progress Notes (Signed)
Plan of Care Note for accepted transfer   Patient: Joyce Pittman MRN: [id number]   DOA: 06/03/2022  Facility requesting transfer: [facility] Requesting Provider: [facility] Reason for transfer: SOB  Facility course: Patient with h/o abnormal lung nodules who was due for repeat CT in January and lost to f/u presenting with SOB.  Needs chest pain, f/u.  Chest pain, 4 episodes x 1 week.  Smokes, does not see doctors.  CP causes numbness to arm and leg, tightness.  EKG with RBBB, unchanged.  No prior cardiac evaluation.  +D-dimer, CTA negative for PE, dissection.  Likely needs Echo, ?MRI (likely to refuse).     Plan of care: The patient is accepted for admission to Telemetry unit, at [facility].   Author: Jonah Blue, MD 06/03/2022  Check [email or web address] for on-call coverage.  Nursing staff, Please call [facility] Admits & Consults System-Wide number on Amion as soon as patient's arrival, so appropriate admitting provider can evaluate the pt.

## 2022-06-03 NOTE — ED Notes (Signed)
Called Lauren at [facility] to CAN Bed Ready Request. Dr Doran Durand discharged patient.-ABB(NS)

## 2022-06-03 NOTE — ED Notes (Signed)
Pt discharged to home. Pt advised to return to ED if symptoms returned or with any other concerns

## 2022-06-03 NOTE — ED Provider Notes (Signed)
Care of patient received from prior provider at 3:45 PM, please see their note for complete H/P and care plan.  Received handoff per ED course.  Clinical Course as of 06/03/22 1545  Sat Jun 03, 2022  1525 Stable 61yof with a chief complaint of chest pain to her back and chest Continued EKG has a RBBB  Pwaves Troponin and DDIMER negative HS 5 Discussed [CC]  1543 I was called to bedside.  Patient was admitted by the prior provider but patient is now choosing to leave the hospital.  She states that her chest pain has resolved, she discussed it with her family and she does not want to be admitted this weekend.  Heart score is elevated but symptoms are resolved troponins are negative and CTA is nondiagnostic.  Given well appearance normal vitals I believe this is reasonable.  I discussed risk for missed cardiac event and patient expressed understanding of possibility of adverse cardiac event in the next 30 days given her elevated heart score.  She still is electing for outpatient care and management. [CC]    Clinical Course User Index [CC] Glyn Ade, MD   Disposition:  Patient is requesting discharge at this time.  Given patient's understanding of risk of severe missed diagnosis based on limitations of today's evaluation and risk of interval worsening of disease including life or limb threatening pathology, will participate in shared medical decision making and patient directed discharge at this time.  Patient is welcome to return for further diagnostic evaluation/therapeutic management at any time.    Glyn Ade, MD 06/03/22 1550

## 2022-06-06 ENCOUNTER — Telehealth: Payer: Self-pay | Admitting: Pulmonary Disease

## 2022-06-06 NOTE — Telephone Encounter (Signed)
Patient had CT scan at Emergency Room. Would like to know if she needs scheduled CT scan. Patient phone number is [phone].

## 2022-06-07 NOTE — Telephone Encounter (Signed)
[facility] Telephone Encounter  Reviewed CTA 06/03/22. Unchanged pulmonary nodules <23mm bilaterally. Ok to cancel CT chest - staff message sent.  Will schedule patient for routine follow-up on 07/27/22 at 8:30 AM

## 2022-07-06 ENCOUNTER — Other Ambulatory Visit: Payer: BC Managed Care – PPO

## 2022-07-27 ENCOUNTER — Ambulatory Visit (HOSPITAL_BASED_OUTPATIENT_CLINIC_OR_DEPARTMENT_OTHER): Payer: BC Managed Care – PPO | Admitting: Pulmonary Disease

## 2022-07-27 ENCOUNTER — Encounter (HOSPITAL_BASED_OUTPATIENT_CLINIC_OR_DEPARTMENT_OTHER): Payer: Self-pay | Admitting: Pulmonary Disease

## 2022-07-27 VITALS — BP 132/74 | HR 75 | Temp 98.2°F | Ht 64.0 in | Wt 155.2 lb

## 2022-07-27 DIAGNOSIS — Z87891 Personal history of nicotine dependence: Secondary | ICD-10-CM

## 2022-07-27 NOTE — Progress Notes (Signed)
Subjective:   PATIENT ID: Joyce Pittman GENDER: female DOB: 1960/07/05, MRN: [id number]   HPI  Chief Complaint  Patient presents with   Follow-up    Follow up. Patient stated things are going good.    Reason for Visit: New consult for abnormal CT  Ms. Joyce Pittman is a 61 year old female active smoker with no significant past medical history who presents for abnormal CT.  Initial consult 10/20/21 She recently had routine lung screening completed.  CT 08/11/21 - Right upper lobe nodule 5 mm and LUL nodule 5 mm.  Numerous upper lobe predominant nodular opacities present.  She started smoking at 62 years old. Smokes 1/2 ppd x 33 years. No MJ use. 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 Denies shortness of breath, cough, wheezing. Denies fevers, chills, weight loss or night sweats. Denies any respiratory illness in the last few months. Has some allergies.  07/27/22  She was recently seen in the ED on 06/03/22 for chest pain. Work-up included CTA CAP. Our office was contacted regarding if she still needed a CT. I reviewed it and it demonstrated unchanged pulmonary nodules <38mm bilaterally. Denies shortness of breath, cough or wheezing. Has cut down to 2-3 cigarettes a day.   Social History: Copy for parents   Past Medical History:  Diagnosis Date   Dizziness      Family History  Problem Relation Age of Onset   Heart disease Father    Hyperlipidemia Father      Social History   Occupational History   Not on file  Tobacco Use   Smoking status: Every Day    Packs/day: 0.50    Years: 43.00    Additional pack years: 0.00    Total pack years: 21.50    Types: Cigarettes   Smokeless tobacco: Not on file  Substance and Sexual Activity   Alcohol use: Yes    Comment: social   Drug use: No   Sexual activity: Yes    Birth  control/protection: Pill    No Known Allergies   Outpatient Medications Prior to Visit  Medication Sig Dispense Refill   Ascorbic Acid (VITAMIN C) 100 MG tablet Take 100 mg by mouth daily.     b complex vitamins tablet Take 1 tablet by mouth daily.     Cholecalciferol (VITAMIN D3) 10 MCG (400 UNIT) CAPS Take by mouth.     No facility-administered medications prior to visit.    Review of Systems  Constitutional:  Negative for chills, diaphoresis, fever, malaise/fatigue and weight loss.  HENT:  Negative for congestion.   Respiratory:  Negative for cough, hemoptysis, sputum production, shortness of breath and wheezing.   Cardiovascular:  Negative for chest pain, palpitations and leg swelling.     Objective:   Vitals:   07/27/22 0830  BP: 132/74  Pulse: 75  Temp: 98.2 F (36.8 C)  TempSrc: Oral  SpO2: 95%  Weight: 155 lb 3.2 oz (70.4 kg)  Height: 5\' 4"  (1.626 m)   SpO2: 95 % O2 Device: None (Room air)  Physical Exam: General: Well-appearing, no acute distress HENT: [place], AT Eyes: EOMI, no scleral icterus Respiratory: Clear to auscultation bilaterally.  No crackles, wheezing or rales Cardiovascular: RRR, -M/R/G, no JVD Extremities:-Edema,-tenderness Neuro: AAO x4, CNII-XII grossly intact Psych: Normal mood, normal affect  Data Reviewed:  Imaging: CT 08/11/21 - Right upper lobe nodule 5 mm and  LUL nodule 5 mm.  Numerous upper lobe predominant nodular opacities present. CTA 06/03/22 - Unchanged pulmonary nodules <57mm bilaterally. No infiltrates or effusion  PFT: None on file  Labs: CBC    Component Value Date/Time   WBC 7.7 06/03/2022 1152   RBC 4.56 06/03/2022 1152   HGB 14.8 06/03/2022 1152   HCT 43.4 06/03/2022 1152   PLT 233 06/03/2022 1152   MCV 95.2 06/03/2022 1152   MCH 32.5 06/03/2022 1152   MCHC 34.1 06/03/2022 1152   RDW 13.0 06/03/2022 1152   LYMPHSABS 1.5 04/28/2013 0926   MONOABS 0.4 04/28/2013 0926   EOSABS 0.1 04/28/2013 0926   BASOSABS 0.0  04/28/2013 0926   BMET    Component Value Date/Time   NA 138 06/03/2022 1152   K 4.0 06/03/2022 1152   CL 107 06/03/2022 1152   CO2 23 06/03/2022 1152   GLUCOSE 86 06/03/2022 1152   BUN 11 06/03/2022 1152   CREATININE 0.62 06/03/2022 1152   CREATININE 0.72 04/28/2013 0926   CALCIUM 9.4 06/03/2022 1152   GFRNONAA >60 06/03/2022 1152    CBC and CMET wnl     Assessment & Plan:   Discussion: 62 year old female active smoker with no significant past medical history who presents for follow-up for subcentimeter nodule. Reviewed CTA CAP as above. No further dedicated imaging needed but would benefit from surveillance imaging. Mild emphysema but asymptomatic   Nodular opacities - stable  Subcentimeter nodules, RUL 5mm, LUL nodule 5mm Mild emphysema --Enroll in lung cancer screening --No bronchodilators indicated  Tobacco abuse Patient is an active smoker. We discussed smoking cessation for 3 minutes. We discussed triggers and stressors and ways to deal with them. We discussed barriers to continued smoking and benefits of smoking cessation. Provided patient with information cessation techniques and interventions including [place] quitline.   Health Maintenance Immunization History  Administered Date(s) Administered   PNEUMOCOCCAL CONJUGATE-20 07/14/2021   Tdap 04/28/2013   CT Lung Screen - enrolled  Orders Placed This Encounter  Procedures   Ambulatory Referral for Lung Cancer Scre    Referral Priority:   Routine    Referral Type:   Consultation    Referral Reason:   Specialty Services Required    Number of Visits Requested:   1  No orders of the defined types were placed in this encounter.   Return if symptoms worsen or fail to improve.  I have spent a total time of 30-minutes on the day of the appointment including chart review, data review, collecting history, coordinating care and discussing medical diagnosis and plan with the patient/family. Past medical history,  allergies, medications were reviewed. Pertinent imaging, labs and tests included in this note have been reviewed and interpreted independently by me.  Joyce Skilton Mechele Collin, MD [facility] 07/27/2022 12:10 PM  Office Number [phone]

## 2022-07-27 NOTE — Patient Instructions (Signed)
Nodular opacities - stable  Subcentimeter nodules, RUL 5mm, LUL nodule 5mm Mild emphysema --Enroll in lung cancer screening --No bronchodilators indicated

## 2023-02-19 ENCOUNTER — Telehealth: Payer: Self-pay

## 2023-02-19 NOTE — Telephone Encounter (Signed)
.  Lung Cancer Screening Narrative/Criteria Questionnaire (Cigarette Smokers Only- No Cigars/Pipes/vapes)   [name]   SDMV:N/A      Alphonsa Gin, RN   1960-12-23   LDCT: N/A    63 y.o.   Phone: [phone]  Lung Screening Narrative (confirm age 71-77 yrs Medicare / 50-80 yrs Private pay insurance)   Insurance information:BCBS   Referring Provider:[place]   This screening involves an initial phone call with a team member from our program. It is called a shared decision making visit. The initial meeting is required by  insurance and Medicare to make sure you understand the program. This appointment takes about 15-20 minutes to complete. You will complete the screening scan at your scheduled date/time.  This scan takes about 5-10 minutes to complete. You can eat and drink normally before and after the scan.  Criteria questions for Lung Cancer Screening:   Are you a current or former smoker? Current Age began smoking: 17   If you are a former smoker, what year did you quit smoking? Quit 10.5 years   To calculate your smoking history, I need an accurate estimate of how many packs of cigarettes you smoked per day and for how many years. (Not just the number of PPD you are now smoking)   Years smoking 34.5 x Packs per day 1 = Pack years 34.5   (at least 20 pack yrs)   (Make sure they understand that we need to know how much they have smoked in the past, not just the number of PPD they are smoking now)  Do you have a personal history of cancer?  No    Do you have a family history of cancer? Yes  (cancer type and and relative) Mother breast  Sister Cervical  Are you coughing up blood?  No  Have you had unexplained weight loss of 15 lbs or more in the last 6 months? No  It looks like you meet all criteria.  When would be a good time for [place] to schedule you for this screening?   Additional information:

## 2023-05-28 ENCOUNTER — Other Ambulatory Visit: Payer: Self-pay

## 2023-05-28 ENCOUNTER — Telehealth: Payer: Self-pay

## 2023-05-28 DIAGNOSIS — F17210 Nicotine dependence, cigarettes, uncomplicated: Secondary | ICD-10-CM

## 2023-05-28 DIAGNOSIS — Z122 Encounter for screening for malignant neoplasm of respiratory organs: Secondary | ICD-10-CM

## 2023-05-28 DIAGNOSIS — Z87891 Personal history of nicotine dependence: Secondary | ICD-10-CM

## 2023-05-28 NOTE — Telephone Encounter (Signed)
.  Lung Cancer Screening Narrative/Criteria Questionnaire (Cigarette Smokers Only- No Cigars/Pipes/vapes)   Joyce Pittman   SDMV:06/20/2023 at 9:00 am with Acie Holiday       03-06-1960   LDCT: 07/23/2023 at 9:00 am at [facility]    63 y.o.   Phone: [phone]  Lung Screening Narrative (confirm age 50-77 yrs Medicare / 50-80 yrs Private pay insurance)   Insurance information: BCBS   Referring Provider: Virgie Griffith   This screening involves an initial phone call with a team member from our program. It is called a shared decision making visit. The initial meeting is required by  insurance and Medicare to make sure you understand the program. This appointment takes about 15-20 minutes to complete. You will complete the screening scan at your scheduled date/time.  This scan takes about 5-10 minutes to complete. You can eat and drink normally before and after the scan.  Criteria questions for Lung Cancer Screening:   Are you a current or former smoker? Current Age began smoking: 18   If you are a former smoker, what year did you quit smoking? Quit 10 years (within 15 yrs)   To calculate your smoking history, I need an accurate estimate of how many packs of cigarettes you smoked per day and for how many years. (Not just the number of PPD you are now smoking)   Years smoking 34 x Packs per day 1 = Pack years 36   (at least 20 pack yrs)   (Make sure they understand that we need to know how much they have smoked in the past, not just the number of PPD they are smoking now)  Do you have a personal history of cancer?  No    Do you have a family history of cancer? Yes  (cancer type and and relative) Mother Breast Cancer Sister Cervical Cancer  Are you coughing up blood?  No  Have you had unexplained weight loss of 15 lbs or more in the last 6 months? No  It looks like you meet all criteria.  When would be a good time for us  to schedule you for this screening?   Additional information: N/A

## 2023-06-20 ENCOUNTER — Encounter: Payer: Self-pay | Admitting: Adult Health

## 2023-06-20 ENCOUNTER — Ambulatory Visit: Admitting: Adult Health

## 2023-06-20 DIAGNOSIS — F17210 Nicotine dependence, cigarettes, uncomplicated: Secondary | ICD-10-CM | POA: Diagnosis not present

## 2023-06-20 NOTE — Progress Notes (Signed)
  Virtual Visit via Telephone Note  I connected with Joyce Pittman , 06/20/23 8:59 AM by a telemedicine application and verified that I am speaking with the correct person using two identifiers.  Location: Patient: home Provider: home   I discussed the limitations of evaluation and management by telemedicine and the availability of in person appointments. The patient expressed understanding and agreed to proceed.   Shared Decision Making Visit Lung Cancer Screening Program [phone])   Eligibility: 63 y.o. Pack Years Smoking History Calculation = 34 pack years  (# packs/per year x # years smoked) Recent History of coughing up blood  no Unexplained weight loss? no ( >Than 15 pounds within the last 6 months ) Prior History Lung / other cancer no (Diagnosis within the last 5 years already requiring surveillance chest CT Scans). Smoking Status Current Smoker   Visit Components: Discussion included one or more decision making aids. YES Discussion included risk/benefits of screening. YES Discussion included potential follow up diagnostic testing for abnormal scans. YES Discussion included meaning and risk of over diagnosis. YES Discussion included meaning and risk of False Positives. YES Discussion included meaning of total radiation exposure. YES  Counseling Included: Importance of adherence to annual lung cancer LDCT screening. YES Impact of comorbidities on ability to participate in the program. YES Ability and willingness to under diagnostic treatment. YES  Smoking Cessation Counseling: Current Smokers:  Discussed importance of smoking cessation. yes Information about tobacco cessation classes and interventions provided to patient. yes Patient provided with "ticket" for LDCT Scan. yes Symptomatic Patient. NO Diagnosis Code: Tobacco Use Z72.0 Asymptomatic Patient yes  Counseling - 4 minutes of smoking cessation counseling (CT Chest Lung Cancer Screening Low Dose W/O CM)  [id number]  Z12.2-Screening of respiratory organs Z87.891-Personal history of nicotine dependence   [place] Dose 06/20/23

## 2023-06-20 NOTE — Patient Instructions (Signed)

## 2023-07-23 ENCOUNTER — Ambulatory Visit
Admission: RE | Admit: 2023-07-23 | Discharge: 2023-07-23 | Disposition: A | Discharge status: Home / Self Care | Source: Ambulatory Visit | Attending: Acute Care | Admitting: Acute Care

## 2023-07-23 DIAGNOSIS — F17210 Nicotine dependence, cigarettes, uncomplicated: Secondary | ICD-10-CM

## 2023-07-23 DIAGNOSIS — Z122 Encounter for screening for malignant neoplasm of respiratory organs: Secondary | ICD-10-CM

## 2023-07-23 DIAGNOSIS — Z87891 Personal history of nicotine dependence: Secondary | ICD-10-CM

## 2023-08-07 ENCOUNTER — Other Ambulatory Visit: Payer: Self-pay

## 2023-08-07 DIAGNOSIS — Z87891 Personal history of nicotine dependence: Secondary | ICD-10-CM

## 2023-08-07 DIAGNOSIS — F17210 Nicotine dependence, cigarettes, uncomplicated: Secondary | ICD-10-CM

## 2023-08-07 DIAGNOSIS — Z122 Encounter for screening for malignant neoplasm of respiratory organs: Secondary | ICD-10-CM

## 2024-07-31 ENCOUNTER — Ambulatory Visit: Admitting: Family Medicine
